# Patient Record
Sex: Male | Born: 1935 | Race: White | Hispanic: No | Marital: Married | Smoking: Never smoker
Health system: Southern US, Community
[De-identification: ages and names within clinical notes are randomized; demographics above are authoritative.]

## PROBLEM LIST (undated history)

## (undated) DIAGNOSIS — I10 Essential (primary) hypertension: Secondary | ICD-10-CM

## (undated) DIAGNOSIS — Z9229 Personal history of other drug therapy: Secondary | ICD-10-CM

## (undated) DIAGNOSIS — I4891 Unspecified atrial fibrillation: Secondary | ICD-10-CM

## (undated) DIAGNOSIS — I82409 Acute embolism and thrombosis of unspecified deep veins of unspecified lower extremity: Secondary | ICD-10-CM

## (undated) DIAGNOSIS — M1990 Unspecified osteoarthritis, unspecified site: Secondary | ICD-10-CM

## (undated) HISTORY — PX: TONSILLECTOMY: SUR1361

---

## 2003-03-16 ENCOUNTER — Emergency Department (HOSPITAL_COMMUNITY): Admission: AD | Admit: 2003-03-16 | Discharge: 2003-03-16 | Payer: Self-pay | Admitting: Family Medicine

## 2008-02-27 ENCOUNTER — Emergency Department (HOSPITAL_COMMUNITY): Admission: EM | Admit: 2008-02-27 | Discharge: 2008-02-27 | Payer: Self-pay | Admitting: Emergency Medicine

## 2008-08-18 ENCOUNTER — Emergency Department (HOSPITAL_COMMUNITY): Admission: EM | Admit: 2008-08-18 | Discharge: 2008-08-18 | Payer: Self-pay | Admitting: Emergency Medicine

## 2010-01-23 ENCOUNTER — Emergency Department (HOSPITAL_COMMUNITY): Admission: EM | Admit: 2010-01-23 | Discharge: 2009-04-10 | Payer: Self-pay | Admitting: Emergency Medicine

## 2010-05-07 LAB — CBC
MCHC: 33.8 g/dL (ref 30.0–36.0)
RBC: 4.22 MIL/uL (ref 4.22–5.81)
RDW: 13.4 % (ref 11.5–15.5)

## 2010-05-07 LAB — BASIC METABOLIC PANEL
CO2: 21 mEq/L (ref 19–32)
Calcium: 9.3 mg/dL (ref 8.4–10.5)
Creatinine, Ser: 0.99 mg/dL (ref 0.4–1.5)
GFR calc Af Amer: >60 mL/min (ref >60)
GFR calc non Af Amer: >60 mL/min (ref >60)
Sodium: 137 mEq/L (ref 135–145)

## 2010-05-07 LAB — DIFFERENTIAL
Basophils Absolute: 0 10*3/uL (ref 0.0–0.1)
Basophils Relative: 1 % (ref 0–1)
Lymphocytes Relative: 21 % (ref 12–46)
Neutro Abs: 3.5 10*3/uL (ref 1.7–7.7)
Neutrophils Relative %: 61 % (ref 43–77)

## 2010-05-07 LAB — POCT CARDIAC MARKERS: Myoglobin, poc: 187 ng/mL (ref 12–200)

## 2010-05-07 LAB — PROTIME-INR: INR: 2.09 — ABNORMAL HIGH (ref 0.00–1.49)

## 2010-05-20 ENCOUNTER — Emergency Department (HOSPITAL_COMMUNITY)
Admission: EM | Admit: 2010-05-20 | Discharge: 2010-05-21 | Disposition: A | Discharge status: Home / Self Care | Payer: Medicare Other | Attending: Emergency Medicine | Admitting: Emergency Medicine

## 2010-05-20 DIAGNOSIS — Z79899 Other long term (current) drug therapy: Secondary | ICD-10-CM | POA: Insufficient documentation

## 2010-05-20 DIAGNOSIS — I10 Essential (primary) hypertension: Secondary | ICD-10-CM | POA: Insufficient documentation

## 2010-05-20 DIAGNOSIS — G4730 Sleep apnea, unspecified: Secondary | ICD-10-CM | POA: Insufficient documentation

## 2010-05-20 DIAGNOSIS — R9431 Abnormal electrocardiogram [ECG] [EKG]: Secondary | ICD-10-CM | POA: Insufficient documentation

## 2010-05-20 DIAGNOSIS — I4891 Unspecified atrial fibrillation: Secondary | ICD-10-CM | POA: Insufficient documentation

## 2010-05-20 DIAGNOSIS — Z7901 Long term (current) use of anticoagulants: Secondary | ICD-10-CM | POA: Insufficient documentation

## 2010-05-25 LAB — BASIC METABOLIC PANEL
BUN: 20 mg/dL (ref 6–23)
CO2: 25 mEq/L (ref 19–32)
Chloride: 107 mEq/L (ref 96–112)
Creatinine, Ser: 1.41 mg/dL (ref 0.4–1.5)
Glucose, Bld: 106 mg/dL — ABNORMAL HIGH (ref 70–99)

## 2010-05-25 LAB — BASIC METABOLIC PANEL WITH GFR
Calcium: 9.6 mg/dL (ref 8.4–10.5)
GFR calc Af Amer: 58 mL/min — ABNORMAL LOW (ref >60)
GFR calc non Af Amer: 48 mL/min — ABNORMAL LOW (ref >60)
Potassium: 3.9 meq/L (ref 3.5–5.1)
Sodium: 138 meq/L (ref 135–145)

## 2010-05-25 LAB — DIFFERENTIAL
Basophils Absolute: 0 10*3/uL (ref 0.0–0.1)
Basophils Relative: 1 % (ref 0–1)
Eosinophils Absolute: 0.4 10*3/uL (ref 0.0–0.7)
Eosinophils Relative: 7 % — ABNORMAL HIGH (ref 0–5)
Lymphocytes Relative: 26 % (ref 12–46)
Lymphs Abs: 1.6 K/uL (ref 0.7–4.0)
Monocytes Absolute: 0.7 K/uL (ref 0.1–1.0)
Monocytes Relative: 11 % (ref 3–12)
Neutro Abs: 3.3 K/uL (ref 1.7–7.7)
Neutrophils Relative %: 55 % (ref 43–77)

## 2010-05-25 LAB — CBC
HCT: 41.2 % (ref 39.0–52.0)
Hemoglobin: 14.2 g/dL (ref 13.0–17.0)
MCHC: 34.4 g/dL (ref 30.0–36.0)
MCV: 94.8 fL (ref 78.0–100.0)
Platelets: 234 10*3/uL (ref 150–400)
RBC: 4.35 MIL/uL (ref 4.22–5.81)
RDW: 13.6 % (ref 11.5–15.5)
WBC: 6 K/uL (ref 4.0–10.5)

## 2010-05-25 LAB — APTT: aPTT: 44 s — ABNORMAL HIGH (ref 24–37)

## 2010-05-25 LAB — PROTIME-INR
INR: 3.2 — ABNORMAL HIGH (ref 0.00–1.49)
Prothrombin Time: 35.5 seconds — ABNORMAL HIGH (ref 11.6–15.2)

## 2010-05-25 LAB — POCT CARDIAC MARKERS
CKMB, poc: 4.8 ng/mL (ref 1.0–8.0)
Myoglobin, poc: 127 ng/mL (ref 12–200)
Troponin i, poc: <0.05 ng/mL (ref 0.00–0.09)

## 2010-06-02 LAB — DIFFERENTIAL
Basophils Absolute: 0 10*3/uL (ref 0.0–0.1)
Eosinophils Relative: 4 % (ref 0–5)
Lymphocytes Relative: 18 % (ref 12–46)
Lymphs Abs: 1.3 10*3/uL (ref 0.7–4.0)
Monocytes Absolute: 0.8 10*3/uL (ref 0.1–1.0)

## 2010-06-02 LAB — CBC
HCT: 40.4 % (ref 39.0–52.0)
Hemoglobin: 13.7 g/dL (ref 13.0–17.0)
RDW: 12.9 % (ref 11.5–15.5)

## 2010-06-02 LAB — BASIC METABOLIC PANEL
Chloride: 101 mEq/L (ref 96–112)
GFR calc non Af Amer: >60 mL/min (ref >60)
Glucose, Bld: 107 mg/dL — ABNORMAL HIGH (ref 70–99)
Potassium: 3.6 mEq/L (ref 3.5–5.1)
Sodium: 136 mEq/L (ref 135–145)

## 2013-01-05 ENCOUNTER — Emergency Department (HOSPITAL_COMMUNITY): Payer: Medicare Other

## 2013-01-05 ENCOUNTER — Emergency Department (HOSPITAL_COMMUNITY)
Admission: EM | Admit: 2013-01-05 | Discharge: 2013-01-05 | Disposition: A | Discharge status: Home / Self Care | Payer: Medicare Other | Attending: Emergency Medicine | Admitting: Emergency Medicine

## 2013-01-05 ENCOUNTER — Encounter (HOSPITAL_COMMUNITY): Payer: Self-pay | Admitting: Emergency Medicine

## 2013-01-05 DIAGNOSIS — R059 Cough, unspecified: Secondary | ICD-10-CM | POA: Insufficient documentation

## 2013-01-05 DIAGNOSIS — I10 Essential (primary) hypertension: Secondary | ICD-10-CM | POA: Insufficient documentation

## 2013-01-05 DIAGNOSIS — R05 Cough: Secondary | ICD-10-CM | POA: Insufficient documentation

## 2013-01-05 DIAGNOSIS — I824Y9 Acute embolism and thrombosis of unspecified deep veins of unspecified proximal lower extremity: Secondary | ICD-10-CM | POA: Insufficient documentation

## 2013-01-05 DIAGNOSIS — I82402 Acute embolism and thrombosis of unspecified deep veins of left lower extremity: Secondary | ICD-10-CM

## 2013-01-05 HISTORY — DX: Unspecified atrial fibrillation: I48.91

## 2013-01-05 HISTORY — DX: Essential (primary) hypertension: I10

## 2013-01-05 LAB — COMPREHENSIVE METABOLIC PANEL
AST: 19 U/L (ref 0–37)
Alkaline Phosphatase: 80 U/L (ref 39–117)
BUN: 35 mg/dL — ABNORMAL HIGH (ref 6–23)
CO2: 26 mEq/L (ref 19–32)
Calcium: 10.1 mg/dL (ref 8.4–10.5)
Chloride: 98 mEq/L (ref 96–112)
Creatinine, Ser: 1.69 mg/dL — ABNORMAL HIGH (ref 0.50–1.35)
GFR calc Af Amer: 41 mL/min — ABNORMAL LOW (ref >90)
GFR calc non Af Amer: 35 mL/min — ABNORMAL LOW (ref >90)
Potassium: 3.7 mEq/L (ref 3.5–5.1)
Total Bilirubin: 0.6 mg/dL (ref 0.3–1.2)

## 2013-01-05 LAB — CBC WITH DIFFERENTIAL/PLATELET
Eosinophils Relative: 1 % (ref 0–5)
HCT: 37.7 % — ABNORMAL LOW (ref 39.0–52.0)
Hemoglobin: 12.3 g/dL — ABNORMAL LOW (ref 13.0–17.0)
Lymphocytes Relative: 12 % (ref 12–46)
MCHC: 32.6 g/dL (ref 30.0–36.0)
MCV: 94.7 fL (ref 78.0–100.0)
Monocytes Absolute: 0.9 10*3/uL (ref 0.1–1.0)
Monocytes Relative: 11 % (ref 3–12)
Neutro Abs: 6.1 10*3/uL (ref 1.7–7.7)
Platelets: 212 10*3/uL (ref 150–400)
WBC: 8.1 10*3/uL (ref 4.0–10.5)

## 2013-01-05 LAB — PRO B NATRIURETIC PEPTIDE: Pro B Natriuretic peptide (BNP): 176.3 pg/mL (ref 0–450)

## 2013-01-05 LAB — TROPONIN I: Troponin I: <0.3 ng/mL (ref <0.30)

## 2013-01-05 LAB — PROTIME-INR: INR: 1.02 (ref 0.00–1.49)

## 2013-01-05 MED ORDER — ENOXAPARIN (LOVENOX) PATIENT EDUCATION KIT
PACK | Freq: Once | Status: DC
  Filled 2013-01-05: qty 1

## 2013-01-05 MED ORDER — SODIUM CHLORIDE 0.9 % IV BOLUS (SEPSIS)
125.0000 mL | Freq: Once | INTRAVENOUS | Status: AC
  Administered 2013-01-05: 125 mL via INTRAVENOUS

## 2013-01-05 MED ORDER — HYDROMORPHONE HCL PF 1 MG/ML IJ SOLN
0.5000 mg | Freq: Once | INTRAMUSCULAR | Status: AC
  Administered 2013-01-05: 0.5 mg via INTRAVENOUS
  Filled 2013-01-05: qty 1

## 2013-01-05 MED ORDER — WARFARIN SODIUM 5 MG PO TABS
5.0000 mg | ORAL_TABLET | Freq: Once | ORAL | Status: AC
  Administered 2013-01-05: 5 mg via ORAL
  Filled 2013-01-05: qty 1

## 2013-01-05 MED ORDER — WARFARIN SODIUM 5 MG PO TABS: 5.0000 mg | ORAL_TABLET | Freq: Every day | ORAL | Status: DC

## 2013-01-05 MED ORDER — COUMADIN BOOK
Freq: Once | Status: DC
  Filled 2013-01-05: qty 1

## 2013-01-05 MED ORDER — ENOXAPARIN SODIUM 100 MG/ML ~~LOC~~ SOLN: 1.5000 mg/kg | SUBCUTANEOUS | Status: DC

## 2013-01-05 MED ORDER — ENOXAPARIN SODIUM 100 MG/ML ~~LOC~~ SOLN
120.0000 mg | Freq: Once | SUBCUTANEOUS | Status: AC
  Administered 2013-01-05: 120 mg via SUBCUTANEOUS
  Filled 2013-01-05: qty 2

## 2013-01-05 MED ORDER — ENOXAPARIN SODIUM 100 MG/ML ~~LOC~~ SOLN: 80.0000 mg | Freq: Once | SUBCUTANEOUS | Status: DC

## 2013-01-05 MED ORDER — WARFARIN - PHYSICIAN DOSING INPATIENT: Freq: Every day | Status: DC

## 2013-01-05 NOTE — ED Notes (Signed)
C/o stabbing right sided cp x 2 days.  Reports has had a bad productive cough and thinks may have cracked a rib.  Reports pain is worse with movement and breathing.  Denies n/v/dizziness/diaphoresis.

## 2013-01-05 NOTE — ED Provider Notes (Signed)
CSN: [id number]     Arrival date & time 01/05/13  4098 History  This chart was scribed for Gerhard Munch, MD by Quintella Reichert, ED scribe.  This patient was seen in room [facility]18/[facility]18 and the patient's care was started at 10:02 AM.   Chief Complaint  Patient presents with  . Chest Pain  . Cough    The history is provided by the patient. No language interpreter was used.    HPI Comments: SHAE AUGELLO is a 79 y.o. male with h/o A-Fib (not on anticoagulants) and HTN who presents to the Emergency Department complaining of 2 days of stabbing right-sided chest pain with associated cough and asymmetrical calf swelling.  Pt states he has had a severe productive cough for 3 days and thinks that he may have cracked a rib or pulled a muscle in his chest.  Pain is worsened by deep breathing and he states he is taking shallow breaths.  He also complains of swelling in his left calf that he noticed 2 days ago.  He denies fever, difficulty swallowing or speaking, weakness or numbness in legs or feet, or any other associated symptoms.  Pt notes that several weeks ago he had similar pain on the left side of his chest and he called EMS.  When they arrived he was told that EKG was normal so he stayed home and his pain resolved by the next day.  He reports he has some postnasal drip chronically with mild associated cough but his most recent cough has been more severe and is productive of a large amount of sputum.  Pt was told by his cardiologist that he does not likely need to take blood-thinners.  He has not had an A-Fib attack in over a year.  He medicates regularly with metoprolol and lisinopril.  He denies h/o kidney problems.  He denies medication allergies.   Past Medical History  Diagnosis Date  . Atrial fibrillation   . Hypertension     Past Surgical History  Procedure Laterality Date  . Tonsillectomy      No family history on file.   History  Substance Use Topics  . Smoking status: Never  Smoker   . Smokeless tobacco: Not on file  . Alcohol Use: 1.2 oz/week    2 Glasses of wine per week     Review of Systems  Constitutional:       Per HPI, otherwise negative  HENT:       Per HPI, otherwise negative  Respiratory:       Per HPI, otherwise negative  Cardiovascular:       Per HPI, otherwise negative  Endocrine:       Negative aside from HPI  Genitourinary:       Neg aside from HPI   Musculoskeletal:       Per HPI, otherwise negative  Skin: Negative.   Neurological: Negative for syncope.     Allergies  Review of patient's allergies indicates no known allergies.  Home Medications  No current outpatient prescriptions on file.  Pulse 85  Temp(Src) 97.9 F (36.6 C) (Oral)  Resp 19  SpO2 95%  Physical Exam  Nursing note and vitals reviewed. Constitutional: He is oriented to person, place, and time. He appears well-developed. No distress.  HENT:  Head: Normocephalic and atraumatic.  Eyes: Conjunctivae and EOM are normal.  Cardiovascular: Normal rate and regular rhythm.   Pulmonary/Chest: Effort normal. No stridor. No respiratory distress. He exhibits tenderness (point tenderness in anterior  axilla, no deformity).  Breath sounds diminished  Abdominal: He exhibits no distension.  Musculoskeletal:  Noticeably larger and somewhat distended in left calf  Neurological: He is alert and oriented to person, place, and time.  Skin: Skin is warm and dry.  Psychiatric: He has a normal mood and affect.    ED Course  Procedures (including critical care time)    COORDINATION OF CARE: 10:12 AM-Discussed treatment plan which includes pain medication, CXR, ultrasound, EKG, and labs with pt at bedside and pt agreed to plan.    Labs Review Labs Reviewed  CBC WITH DIFFERENTIAL - Abnormal; Notable for the following:    RBC 3.98 (*)    Hemoglobin 12.3 (*)    HCT 37.7 (*)    All other components within normal limits  COMPREHENSIVE METABOLIC PANEL - Abnormal;  Notable for the following:    Sodium 134 (*)    Glucose, Bld 106 (*)    BUN 35 (*)    Creatinine, Ser 1.69 (*)    Total Protein 9.1 (*)    GFR calc non Af Amer 35 (*)    GFR calc Af Amer 41 (*)    All other components within normal limits  D-DIMER, QUANTITATIVE - Abnormal; Notable for the following:    D-Dimer, Quant 6.18 (*)    All other components within normal limits  TROPONIN I  PRO B NATRIURETIC PEPTIDE  PROTIME-INR    Imaging Review Dg Ribs Unilateral W/chest Right  01/05/2013   CLINICAL DATA:  Right side chest pain for 3 days.  EXAM: RIGHT RIBS AND CHEST - 3+ VIEW  COMPARISON:  PA and lateral chest 08/18/2008.  FINDINGS: Lung volumes are low with mild subsegmental atelectasis in the lung bases, more notable on the left. No pneumothorax or pleural fluid is identified. Heart size is normal. No rib fracture is seen.  IMPRESSION: Negative for fracture.  No acute finding.   Electronically Signed   By: Drusilla Kanner M.D.   On: 01/05/2013 10:50   US Venous Img Lower Bilateral  01/05/2013   CLINICAL DATA:  Lower extremity and chest pain  EXAM: VENOUS DUPLEX ULTRASOUND OF BILATERAL LOWER EXTREMITIES  TECHNIQUE: Gray-scale sonography with graded compression, as well as color Doppler and duplex ultrasound, were performed to evaluate the deep venous system from the level of the common femoral vein through the popliteal and proximal calf veins. Spectral Doppler was utilized to evaluate flow at rest and with distal augmentation maneuvers.  COMPARISON:  None.  FINDINGS: On the left, there is acute obstructing thrombus in the distal superficial femoral and popliteal veins consistent with acute deep venous thrombosis. There is absence of spontaneity and phasisity of flow in these areas. There is absence of compression and augmentation in these areas of acute thrombus on the left. There is node venous Doppler signal in the areas of acute obstructive deep venous thrombosis. Elsewhere in the left lower  extremity, no thrombus is seen. There is normal compression and augmentation in areas for thrombus is not seen. Venous Doppler signal is normal in these regions. Flow is spontaneous and phasic in areas where acute thrombus is not seen on the left. No superficial venous thrombosis seen on the left.  On the right, flow is spontaneous and phasic in all segments. There is normal compression and augmentation in the venous structures of the right lower extremity. Venous Doppler is normal in all regions on the right. There is no evidence of right lower extremity deep venous thrombosis. No thrombus  is seen in superficial venous structures on the right.  There is no deep venous incompetence on either side.  IMPRESSION: Acute appearing obstructive thrombus in the distal superficial femoral vein and popliteal vein regions on the left, consistent with acute deep venous thrombosis on the left.  There is no right-sided deep venous thrombosis.  Study otherwise unremarkable.  Critical Value/emergent results were called by telephone at the time of interpretation on 01/05/2013 at 11:42 AM to Dr.Brinton Brandel , who verbally acknowledged these results.   Electronically Signed   By: Bretta Bang M.D.   On: 01/05/2013 11:42    EKG Interpretation    Date/Time:  Thursday January 05 2013 09:50:41 EST Ventricular Rate:  80 PR Interval:  202 QRS Duration: 98 QT Interval:  378 QTC Calculation: 435 R Axis:   37 Text Interpretation:  Normal sinus rhythm Normal ECG When compared with ECG of 20-May-2010 23:54, Sinus rhythm has replaced Atrial fibrillation Artifact Normal ECG Confirmed by Gerhard Munch  MD [phone]) on 01/05/2013 12:10:04 PM           Update: I discussed all results with the patient and his wife. He requested outpatient management of his condition.  After I explained my recommendation for admission, and he continued to request outpatient management we had a lengthy discussion of the need for medication  compliance, and close monitoring. MDM   1. DVT (deep venous thrombosis), left    I personally performed the services described in this documentation, which was scribed in my presence. The recorded information has been reviewed and is accurate.   This patient p/w R inferior chest pain.  On exam his VS are stable.  With his L calf enlargement, there was concern for thromboembolism vs. Thrombosis.  Patient's evaluation demonstrates presence of left lower extremity DVT.  There remains concern for possible pulmonary muscle but given the patient's poor renal function CTA was not performed.  Patient was receiving treatment for thrombosis regardless, and this is unlikely to have changed management substantially, given the absence of hemodynamic instability, distress.  The patient adamantly insisted on outpatient management.  Given his age, capacity to followup with primary care/cardiology for INR checks, as he has been on this medication previously, this seemed reasonable.  We had lengthy conversations and return precautions.  The patient received Lovenox teaching, initial dose of anticoagulant, was discharged in stable condition.   Gerhard Munch, MD 01/06/13 [phone]

## 2013-01-05 NOTE — ED Notes (Signed)
Pt c/o right side chest pain x3 days. Pt reports "severe cough" that is productive with "regular sputum". Pt states movement and coughing worsens pain. Pt denies SOB.

## 2013-01-05 NOTE — ED Notes (Signed)
Spoke with Marcelino Duster in pharmacy and she will bring lovenox teaching kit.  Also waiting for INR result.  Pt notified of delay.

## 2013-03-08 ENCOUNTER — Encounter (HOSPITAL_COMMUNITY): Payer: Self-pay | Admitting: Emergency Medicine

## 2013-03-08 DIAGNOSIS — I10 Essential (primary) hypertension: Secondary | ICD-10-CM | POA: Insufficient documentation

## 2013-03-08 DIAGNOSIS — Z8639 Personal history of other endocrine, nutritional and metabolic disease: Secondary | ICD-10-CM | POA: Insufficient documentation

## 2013-03-08 DIAGNOSIS — Z7982 Long term (current) use of aspirin: Secondary | ICD-10-CM | POA: Insufficient documentation

## 2013-03-08 DIAGNOSIS — L089 Local infection of the skin and subcutaneous tissue, unspecified: Secondary | ICD-10-CM | POA: Insufficient documentation

## 2013-03-08 DIAGNOSIS — Z7901 Long term (current) use of anticoagulants: Secondary | ICD-10-CM | POA: Insufficient documentation

## 2013-03-08 DIAGNOSIS — Z862 Personal history of diseases of the blood and blood-forming organs and certain disorders involving the immune mechanism: Secondary | ICD-10-CM | POA: Insufficient documentation

## 2013-03-08 DIAGNOSIS — Z792 Long term (current) use of antibiotics: Secondary | ICD-10-CM | POA: Insufficient documentation

## 2013-03-08 DIAGNOSIS — Z79899 Other long term (current) drug therapy: Secondary | ICD-10-CM | POA: Insufficient documentation

## 2013-03-08 DIAGNOSIS — I4891 Unspecified atrial fibrillation: Secondary | ICD-10-CM | POA: Insufficient documentation

## 2013-03-08 DIAGNOSIS — Z86718 Personal history of other venous thrombosis and embolism: Secondary | ICD-10-CM | POA: Insufficient documentation

## 2013-03-08 NOTE — ED Notes (Signed)
Pt states his right great toe and 2nd toe are red, swollen, and painful, states the 2nd toe started with an ingrown nail and seems to have spread to the great toe.

## 2013-03-09 ENCOUNTER — Emergency Department (HOSPITAL_COMMUNITY)
Admission: EM | Admit: 2013-03-09 | Discharge: 2013-03-09 | Disposition: A | Discharge status: Home / Self Care | Payer: Medicare Other | Attending: Emergency Medicine | Admitting: Emergency Medicine

## 2013-03-09 DIAGNOSIS — L089 Local infection of the skin and subcutaneous tissue, unspecified: Secondary | ICD-10-CM

## 2013-03-09 MED ORDER — OXYCODONE-ACETAMINOPHEN 5-325 MG PO TABS: 2.0000 | ORAL_TABLET | ORAL | Status: AC | PRN

## 2013-03-09 MED ORDER — CLINDAMYCIN HCL 150 MG PO CAPS
300.0000 mg | ORAL_CAPSULE | Freq: Once | ORAL | Status: AC
  Administered 2013-03-09: 300 mg via ORAL
  Filled 2013-03-09: qty 2

## 2013-03-09 MED ORDER — HYDROCODONE-ACETAMINOPHEN 5-325 MG PO TABS: 1.0000 | ORAL_TABLET | ORAL | Status: DC | PRN

## 2013-03-09 MED ORDER — CLINDAMYCIN HCL 300 MG PO CAPS: 300.0000 mg | ORAL_CAPSULE | Freq: Four times a day (QID) | ORAL | Status: AC

## 2013-03-09 NOTE — ED Notes (Signed)
Discharge instructions given and reviewed with patient.  Prescriptions given for Hydrocodone and Cleocin; effects and use explained.  Percocet pre-pack given.  Patient verbalized understanding to complete all antibiotic and sedating effects of pain medication.  Patient ambulatory; discharged home in good condition.

## 2013-03-09 NOTE — ED Provider Notes (Signed)
CSN: [id number]     Arrival date & time 03/08/13  2333 History   First MD Initiated Contact with Patient 03/09/13 0105     Chief Complaint  Patient presents with  . Nail Problem    HPI Patient reports possible infection in his second toe on his right foot.  He reports a swollen painful.  He states that he injured his toe cutting his toenails approximately 10 days ago.  Now has developed pain at the base of his first toe of his right foot.  He does have a history of gout.  He's not convinced this is like his prior gout.  Denies fevers and chills.  He states that the majority of the redness is associated to the second toe.  He states pain on ambulation.  He has not seen his primary care physician.  Over-the-counter pain medications without improvement in his symptoms.  He cut it comes because of severity of throbbing pain.  Otherwise he has no complaints.  He does have a history of DVT and is on Coumadin.   Past Medical History  Diagnosis Date  . Atrial fibrillation   . Hypertension    Past Surgical History  Procedure Laterality Date  . Tonsillectomy     No family history on file. History  Substance Use Topics  . Smoking status: Never Smoker   . Smokeless tobacco: Not on file  . Alcohol Use: 1.2 oz/week    2 Glasses of wine per week    Review of Systems  All other systems reviewed and are negative.    Allergies  Review of patient's allergies indicates no known allergies.  Home Medications   Current Outpatient Rx  Name  Route  Sig  Dispense  Refill  . lisinopril-hydrochlorothiazide (PRINZIDE,ZESTORETIC) 10-12.5 MG per tablet   Oral   Take 1 tablet by mouth daily.         . metoprolol succinate (TOPROL-XL) 50 MG 24 hr tablet   Oral   Take 50 mg by mouth at bedtime.         Marland Kitchen warfarin (COUMADIN) 5 MG tablet   Oral   Take 1 tablet (5 mg total) by mouth daily.   30 tablet   0   . aspirin 81 MG tablet   Oral   Take 81 mg by mouth daily.         . clindamycin  (CLEOCIN) 300 MG capsule   Oral   Take 1 capsule (300 mg total) by mouth 4 (four) times daily.   28 capsule   0   . enoxaparin (LOVENOX) 100 MG/ML injection   Subcutaneous   Inject 1.2 mLs (120 mg total) into the skin daily. Please stop upon direction of your physician   10 Syringe   0   . HYDROcodone-acetaminophen (NORCO/VICODIN) 5-325 MG per tablet   Oral   Take 1 tablet by mouth every 4 (four) hours as needed for moderate pain.   10 tablet   0   . oxyCODONE-acetaminophen (PERCOCET/ROXICET) 5-325 MG per tablet   Oral   Take 2 tablets by mouth every 4 (four) hours as needed for severe pain.   6 tablet   0    BP 155/75  Pulse 78  Temp(Src) 97.9 F (36.6 C) (Oral)  Resp 18  Ht 5\' 9"  (1.753 m)  Wt 180 lb (81.647 kg)  BMI 26.57 kg/m2  SpO2 97% Physical Exam  Nursing note and vitals reviewed. Constitutional: He is oriented to person, place, and time.  He appears well-developed and well-nourished.  HENT:  Head: Normocephalic.  Eyes: EOM are normal.  Neck: Normal range of motion.  Pulmonary/Chest: Effort normal.  Abdominal: He exhibits no distension.  Musculoskeletal: Normal range of motion.  Normal PT and DP pulses bilaterally.  Patient with erythema and tenderness of his right second toe, without obvious fluctuance or drainable abscess.  Patient does have some tenderness at the base of his great toe on his right foot without erythema or fluctuance or obvious effusion.  Neurological: He is alert and oriented to person, place, and time.  Psychiatric: He has a normal mood and affect.    ED Course  Procedures (including critical care time) Labs Review Labs Reviewed - No data to display Imaging Review No results found.  EKG Interpretation   None       MDM   1. Toe infection    Patient appears to have an infection likely stemming from a paronychia or infection of his right second toe.  Patient be started on antibiotics.  Patient does have pain it is base of his  first toe on his right foot as well.  This could represent a gout flare although could be related and reactive from his toe infection on his right second toe.  Not convinced that this is a septic arthritis at this time.  The patient be started on antibiotics and pain medicine and he has been asked to follow back up in the emergency department in 48 hours for recheck.  Have asked the patient to return sooner as needed for worsening symptoms.  Overall well-appearing.  No fevers or chills.  No signs of systemic illness.  Very functional 80 year old male    Lyanne CoKevin M Kayne Yuhas, MD 03/09/13 [phone]

## 2013-03-11 ENCOUNTER — Emergency Department (HOSPITAL_COMMUNITY)
Admission: EM | Admit: 2013-03-11 | Discharge: 2013-03-11 | Disposition: A | Discharge status: Home / Self Care | Payer: Medicare Other | Attending: Emergency Medicine | Admitting: Emergency Medicine

## 2013-03-11 ENCOUNTER — Encounter (HOSPITAL_COMMUNITY): Payer: Self-pay | Admitting: Emergency Medicine

## 2013-03-11 ENCOUNTER — Emergency Department (HOSPITAL_COMMUNITY): Payer: Medicare Other

## 2013-03-11 DIAGNOSIS — L0889 Other specified local infections of the skin and subcutaneous tissue: Secondary | ICD-10-CM | POA: Insufficient documentation

## 2013-03-11 DIAGNOSIS — L089 Local infection of the skin and subcutaneous tissue, unspecified: Secondary | ICD-10-CM

## 2013-03-11 DIAGNOSIS — Z7901 Long term (current) use of anticoagulants: Secondary | ICD-10-CM | POA: Insufficient documentation

## 2013-03-11 DIAGNOSIS — Z792 Long term (current) use of antibiotics: Secondary | ICD-10-CM | POA: Insufficient documentation

## 2013-03-11 DIAGNOSIS — M79609 Pain in unspecified limb: Secondary | ICD-10-CM | POA: Insufficient documentation

## 2013-03-11 DIAGNOSIS — I4891 Unspecified atrial fibrillation: Secondary | ICD-10-CM | POA: Insufficient documentation

## 2013-03-11 DIAGNOSIS — I10 Essential (primary) hypertension: Secondary | ICD-10-CM | POA: Insufficient documentation

## 2013-03-11 DIAGNOSIS — Z79899 Other long term (current) drug therapy: Secondary | ICD-10-CM | POA: Insufficient documentation

## 2013-03-11 LAB — COMPREHENSIVE METABOLIC PANEL
ALK PHOS: 76 U/L (ref 39–117)
ALT: 17 U/L (ref 0–53)
AST: 5 U/L (ref 0–37)
Albumin: 3.4 g/dL — ABNORMAL LOW (ref 3.5–5.2)
BILIRUBIN TOTAL: 0.3 mg/dL (ref 0.3–1.2)
BUN: 35 mg/dL — AB (ref 6–23)
CHLORIDE: 102 meq/L (ref 96–112)
CO2: 22 mEq/L (ref 19–32)
Calcium: 9 mg/dL (ref 8.4–10.5)
Creatinine, Ser: 1.59 mg/dL — ABNORMAL HIGH (ref 0.50–1.35)
GFR calc Af Amer: 44 mL/min — ABNORMAL LOW (ref >90)
GFR calc non Af Amer: 38 mL/min — ABNORMAL LOW (ref >90)
Glucose, Bld: 130 mg/dL — ABNORMAL HIGH (ref 70–99)
POTASSIUM: 4.2 meq/L (ref 3.7–5.3)
SODIUM: 136 meq/L — AB (ref 137–147)
TOTAL PROTEIN: 8.2 g/dL (ref 6.0–8.3)

## 2013-03-11 LAB — CBC WITH DIFFERENTIAL/PLATELET
BASOS PCT: 0 % (ref 0–1)
Basophils Absolute: 0 10*3/uL (ref 0.0–0.1)
Eosinophils Absolute: 0.4 10*3/uL (ref 0.0–0.7)
Eosinophils Relative: 8 % — ABNORMAL HIGH (ref 0–5)
HCT: 36.3 % — ABNORMAL LOW (ref 39.0–52.0)
HEMOGLOBIN: 12.2 g/dL — AB (ref 13.0–17.0)
LYMPHS ABS: 1.1 10*3/uL (ref 0.7–4.0)
Lymphocytes Relative: 21 % (ref 12–46)
MCH: 31.2 pg (ref 26.0–34.0)
MCHC: 33.6 g/dL (ref 30.0–36.0)
MCV: 92.8 fL (ref 78.0–100.0)
Monocytes Absolute: 0.5 10*3/uL (ref 0.1–1.0)
Monocytes Relative: 10 % (ref 3–12)
NEUTROS PCT: 61 % (ref 43–77)
Neutro Abs: 3.2 10*3/uL (ref 1.7–7.7)
PLATELETS: 246 10*3/uL (ref 150–400)
RBC: 3.91 MIL/uL — AB (ref 4.22–5.81)
RDW: 13.4 % (ref 11.5–15.5)
WBC: 5.2 10*3/uL (ref 4.0–10.5)

## 2013-03-11 LAB — PROTIME-INR
INR: 2.95 — ABNORMAL HIGH (ref 0.00–1.49)
PROTHROMBIN TIME: 29.7 s — AB (ref 11.6–15.2)

## 2013-03-11 MED ORDER — VANCOMYCIN HCL IN DEXTROSE 1-5 GM/200ML-% IV SOLN
1000.0000 mg | Freq: Once | INTRAVENOUS | Status: AC
  Administered 2013-03-11: 1000 mg via INTRAVENOUS
  Filled 2013-03-11: qty 200

## 2013-03-11 NOTE — Discharge Instructions (Signed)
Continue antibiotics and follow up with your md Monday for recheck

## 2013-03-11 NOTE — ED Notes (Signed)
Pt states he was seen recently for infection to left 2nd toe and given abx for treatment. Pt was told by EDP to return to ED for recheck today.

## 2013-03-11 NOTE — ED Provider Notes (Signed)
CSN: [id number]     Arrival date & time 03/11/13  1453 History   First MD Initiated Contact with Patient 03/11/13 1511     Chief Complaint  Patient presents with  . Wound Check   (Consider location/radiation/quality/duration/timing/severity/associated sxs/prior Treatment) Patient is a 80 y.o. male presenting with wound check. The history is provided by the patient (pt here for recheck of right 2nd toe.  he is being treated with cleocin for infection).  Wound Check This is a new problem. The current episode started more than 2 days ago. The problem occurs constantly. The problem has been gradually improving. Pertinent negatives include no chest pain, no abdominal pain and no headaches. Nothing aggravates the symptoms. Nothing relieves the symptoms.    Past Medical History  Diagnosis Date  . Atrial fibrillation   . Hypertension    Past Surgical History  Procedure Laterality Date  . Tonsillectomy     History reviewed. No pertinent family history. History  Substance Use Topics  . Smoking status: Never Smoker   . Smokeless tobacco: Not on file  . Alcohol Use: 1.2 oz/week    2 Glasses of wine per week    Review of Systems  Constitutional: Negative for appetite change and fatigue.  HENT: Negative for congestion, ear discharge and sinus pressure.   Eyes: Negative for discharge.  Respiratory: Negative for cough.   Cardiovascular: Negative for chest pain.  Gastrointestinal: Negative for abdominal pain and diarrhea.  Genitourinary: Negative for frequency and hematuria.  Musculoskeletal: Negative for back pain.       Pain right 2nd toe  Skin: Negative for rash.  Neurological: Negative for seizures and headaches.  Psychiatric/Behavioral: Negative for hallucinations.    Allergies  Lactose intolerance (gi)  Home Medications   Current Outpatient Rx  Name  Route  Sig  Dispense  Refill  . cetirizine (QC ALL DAY ALLERGY) 10 MG tablet   Oral   Take 10 mg by mouth daily.          . clindamycin (CLEOCIN) 300 MG capsule   Oral   Take 1 capsule (300 mg total) by mouth 4 (four) times daily.   28 capsule   0   . lisinopril-hydrochlorothiazide (PRINZIDE,ZESTORETIC) 10-12.5 MG per tablet   Oral   Take 1 tablet by mouth daily.         . metoprolol succinate (TOPROL-XL) 50 MG 24 hr tablet   Oral   Take 50 mg by mouth at bedtime.         Marland Kitchen oxyCODONE-acetaminophen (PERCOCET/ROXICET) 5-325 MG per tablet   Oral   Take 2 tablets by mouth every 4 (four) hours as needed for severe pain.   6 tablet   0   . warfarin (COUMADIN) 5 MG tablet   Oral   Take 1 tablet (5 mg total) by mouth daily.   30 tablet   0    BP 133/61  Pulse 71  Temp(Src) 97.4 F (36.3 C) (Oral)  Resp 19  SpO2 99% Physical Exam  Constitutional: He is oriented to person, place, and time. He appears well-developed.  HENT:  Head: Normocephalic.  Eyes: Conjunctivae are normal.  Neck: No tracheal deviation present.  Cardiovascular:  No murmur heard. Musculoskeletal: Normal range of motion.  Swollen tender right 2nd toe.  Improving according to pt  Neurological: He is oriented to person, place, and time.  Skin: Skin is warm.  Psychiatric: He has a normal mood and affect.    ED Course  Procedures (including  critical care time) Labs Review Labs Reviewed  CBC WITH DIFFERENTIAL - Abnormal; Notable for the following:    RBC 3.91 (*)    Hemoglobin 12.2 (*)    HCT 36.3 (*)    Eosinophils Relative 8 (*)    All other components within normal limits  COMPREHENSIVE METABOLIC PANEL - Abnormal; Notable for the following:    Sodium 136 (*)    Glucose, Bld 130 (*)    BUN 35 (*)    Creatinine, Ser 1.59 (*)    Albumin 3.4 (*)    GFR calc non Af Amer 38 (*)    GFR calc Af Amer 44 (*)    All other components within normal limits  PROTIME-INR - Abnormal; Notable for the following:    Prothrombin Time 29.7 (*)    INR 2.95 (*)    All other components within normal limits   Imaging Review Dg  Foot Complete Right  03/11/2013   CLINICAL DATA:  Toe pain, erythema, and swelling.  EXAM: RIGHT FOOT COMPLETE - 3+ VIEW  COMPARISON:  None.  FINDINGS: Mild soft tissue swelling is seen involving the right second toe. No evidence of fracture or dislocation. No evidence of osteolysis or periosteal reaction. No evidence of radiopaque foreign body or soft tissue emphysema. No other significant bone abnormality involving the toes or foot.  IMPRESSION: Soft tissue swelling of the second toe.  No osseous abnormality.   Electronically Signed   By: Myles RosenthalJohn  Stahl M.D.   On: 03/11/2013 16:26    EKG Interpretation   None       MDM   1. Skin infection    Follow up in 2 days   Benny LennertJoseph L Jabria Loos, MD 03/11/13 [phone]

## 2013-03-11 NOTE — ED Notes (Signed)
Patient with no complaints at this time. Respirations even and unlabored. Skin warm/dry. Discharge instructions reviewed with patient at this time. Patient given opportunity to voice concerns/ask questions. IV removed per policy and band-aid applied to site. Patient discharged at this time and left Emergency Department with steady gait.  

## 2013-03-11 NOTE — ED Notes (Signed)
CRITICAL VALUE ALERT  Critical value received:  Correction value received- AST 5-  Was told first result was on undiluted sample and was incorrect.  Reports 2nd result in computer is correct and WNL per lab Date of notification:  03/11/2013  Time of notification:  1643  Critical value read back:yes  Nurse who received alert[facility]  [facility]  MD notified (1st page):  Dr. Estell HarpinZammit  Time of first page:  1644  MD notified (2nd page):  Time of second page:  Responding MD:  Dr. Estell HarpinZammit  Time MD responded:  Dr. Estell HarpinZammit

## 2013-03-15 MED FILL — Oxycodone w/ Acetaminophen Tab 5-325 MG: ORAL | Qty: 6 | Status: AC

## 2013-03-19 ENCOUNTER — Emergency Department (HOSPITAL_COMMUNITY)
Admission: EM | Admit: 2013-03-19 | Discharge: 2013-03-19 | Disposition: A | Discharge status: Home / Self Care | Payer: Medicare Other | Attending: Emergency Medicine | Admitting: Emergency Medicine

## 2013-03-19 ENCOUNTER — Encounter (HOSPITAL_COMMUNITY): Payer: Self-pay | Admitting: Emergency Medicine

## 2013-03-19 DIAGNOSIS — Z792 Long term (current) use of antibiotics: Secondary | ICD-10-CM | POA: Insufficient documentation

## 2013-03-19 DIAGNOSIS — I4891 Unspecified atrial fibrillation: Secondary | ICD-10-CM | POA: Insufficient documentation

## 2013-03-19 DIAGNOSIS — Z79899 Other long term (current) drug therapy: Secondary | ICD-10-CM | POA: Insufficient documentation

## 2013-03-19 DIAGNOSIS — Z7901 Long term (current) use of anticoagulants: Secondary | ICD-10-CM | POA: Insufficient documentation

## 2013-03-19 DIAGNOSIS — I10 Essential (primary) hypertension: Secondary | ICD-10-CM | POA: Insufficient documentation

## 2013-03-19 DIAGNOSIS — K625 Hemorrhage of anus and rectum: Secondary | ICD-10-CM | POA: Insufficient documentation

## 2013-03-19 LAB — CBC WITH DIFFERENTIAL/PLATELET
BASOS PCT: 1 % (ref 0–1)
Basophils Absolute: 0.1 10*3/uL (ref 0.0–0.1)
Eosinophils Absolute: 0.5 10*3/uL (ref 0.0–0.7)
Eosinophils Relative: 10 % — ABNORMAL HIGH (ref 0–5)
HEMATOCRIT: 37.2 % — AB (ref 39.0–52.0)
Hemoglobin: 12.7 g/dL — ABNORMAL LOW (ref 13.0–17.0)
Lymphocytes Relative: 23 % (ref 12–46)
Lymphs Abs: 1.2 10*3/uL (ref 0.7–4.0)
MCH: 31.4 pg (ref 26.0–34.0)
MCHC: 34.1 g/dL (ref 30.0–36.0)
MCV: 91.9 fL (ref 78.0–100.0)
MONO ABS: 0.4 10*3/uL (ref 0.1–1.0)
Monocytes Relative: 8 % (ref 3–12)
Neutro Abs: 3 10*3/uL (ref 1.7–7.7)
Neutrophils Relative %: 58 % (ref 43–77)
Platelets: 358 10*3/uL (ref 150–400)
RBC: 4.05 MIL/uL — ABNORMAL LOW (ref 4.22–5.81)
RDW: 12.9 % (ref 11.5–15.5)
WBC: 5.2 10*3/uL (ref 4.0–10.5)

## 2013-03-19 LAB — BASIC METABOLIC PANEL
BUN: 30 mg/dL — AB (ref 6–23)
CHLORIDE: 102 meq/L (ref 96–112)
CO2: 21 mEq/L (ref 19–32)
CREATININE: 1.49 mg/dL — AB (ref 0.50–1.35)
Calcium: 9.5 mg/dL (ref 8.4–10.5)
GFR calc non Af Amer: 41 mL/min — ABNORMAL LOW (ref >90)
GFR, EST AFRICAN AMERICAN: 47 mL/min — AB (ref >90)
GLUCOSE: 91 mg/dL (ref 70–99)
POTASSIUM: 4.1 meq/L (ref 3.7–5.3)
Sodium: 137 mEq/L (ref 137–147)

## 2013-03-19 LAB — PROTIME-INR
INR: 1.57 — ABNORMAL HIGH (ref 0.00–1.49)
Prothrombin Time: 18.3 seconds — ABNORMAL HIGH (ref 11.6–15.2)

## 2013-03-19 NOTE — ED Notes (Signed)
Pt reports being treated with antibiotics for an infected toe.  Reports that he noticed blood in his stool yesterday morning.  Pt also on coumadin. Reports last PT/INR slightly elevated.

## 2013-03-19 NOTE — ED Provider Notes (Signed)
CSN: [id number]     Arrival date & time 03/19/13  0611 History   First MD Initiated Contact with Patient 03/19/13 [phone]     Chief Complaint  Patient presents with  . GI Bleeding    Patient is a 80 y.o. male presenting with hematochezia. The history is provided by the patient and the spouse.  Rectal Bleeding Quality: black. Amount:  Scant Timing:  Intermittent Progression:  Unchanged Chronicity:  New Similar prior episodes: no   Relieved by:  None tried Worsened by:  Nothing tried Associated symptoms: no abdominal pain, no dizziness, no fever, no hematemesis and no vomiting   Risk factors: anticoagulant use    Pt reports his PCP told him recently that his coumadin/INR level was elevated.  He was advised to hold doses.  She advised him to look out for any signs of bleeding Yesterday he noticed his stool was black.  He denies any bright red blood in stool No abd pain No weakness/dizziness No vomiting He has never had this before, denies known h/o GI bleed Past Medical History  Diagnosis Date  . Atrial fibrillation   . Hypertension    Past Surgical History  Procedure Laterality Date  . Tonsillectomy     History reviewed. No pertinent family history. History  Substance Use Topics  . Smoking status: Never Smoker   . Smokeless tobacco: Not on file  . Alcohol Use: 1.2 oz/week    2 Glasses of wine per week    Review of Systems  Constitutional: Negative for fever.  Gastrointestinal: Positive for hematochezia. Negative for vomiting, abdominal pain and hematemesis.  Neurological: Negative for dizziness.  All other systems reviewed and are negative.    Allergies  Lactose intolerance (gi)  Home Medications   Current Outpatient Rx  Name  Route  Sig  Dispense  Refill  . amoxicillin-clavulanate (AUGMENTIN) 875-125 MG per tablet   Oral   Take 1 tablet by mouth 2 (two) times daily.         . cetirizine (QC ALL DAY ALLERGY) 10 MG tablet   Oral   Take 10 mg by mouth  daily.         . clindamycin (CLEOCIN) 300 MG capsule   Oral   Take 1 capsule (300 mg total) by mouth 4 (four) times daily.   28 capsule   0   . lisinopril-hydrochlorothiazide (PRINZIDE,ZESTORETIC) 10-12.5 MG per tablet   Oral   Take 1 tablet by mouth daily.         . metoprolol succinate (TOPROL-XL) 50 MG 24 hr tablet   Oral   Take 50 mg by mouth at bedtime.         Marland Kitchen oxyCODONE-acetaminophen (PERCOCET/ROXICET) 5-325 MG per tablet   Oral   Take 2 tablets by mouth every 4 (four) hours as needed for severe pain.   6 tablet   0   . warfarin (COUMADIN) 5 MG tablet   Oral   Take 1 tablet (5 mg total) by mouth daily.   30 tablet   0    BP 143/76  Pulse 68  Temp(Src) 97.7 F (36.5 C) (Oral)  Resp 14  SpO2 97% BP 116/73  Pulse 66  Temp(Src) 97.7 F (36.5 C) (Oral)  Resp 18  SpO2 97%  Physical Exam CONSTITUTIONAL: Well developed/well nourished HEAD: Normocephalic/atraumatic EYES: EOMI/PERRL, conjunctiva pink ENMT: Mucous membranes moist NECK: supple no meningeal signs SPINE:entire spine nontender CV:  no murmurs/rubs/gallops noted LUNGS: Lungs are clear to auscultation bilaterally, no  apparent distress ABDOMEN: soft, nontender, no rebound or guarding Rectal - external hemorrhoid noted.  No mass/abscess noted.  Small amt of stool noted that is brown.  No blood or melena noted.  Faint positive hemoccult GU:no cva tenderness NEURO: Pt is awake/alert, moves all extremitiesx4 EXTREMITIES: pulses normal, full ROM SKIN: warm, color normal PSYCH: no abnormalities of mood noted  ED Course  Procedures (including critical care time) 8:00 AM No signs of anemia His coumadin is now subtherapeutic, but he had held recent doses per his PCP request and now back to his normal dosing My suspicion for acute/emergent GI bleed is low We discussed strict return precautions He has f/u PCP visit this week Labs Review Labs Reviewed  BASIC METABOLIC PANEL - Abnormal; Notable  for the following:    BUN 30 (*)    Creatinine, Ser 1.49 (*)    GFR calc non Af Amer 41 (*)    GFR calc Af Amer 47 (*)    All other components within normal limits  CBC WITH DIFFERENTIAL - Abnormal; Notable for the following:    RBC 4.05 (*)    Hemoglobin 12.7 (*)    HCT 37.2 (*)    Eosinophils Relative 10 (*)    All other components within normal limits  PROTIME-INR   Imaging Review No results found.  EKG Interpretation   None       MDM  No diagnosis found. Nursing notes including past medical history and social history reviewed and considered in documentation Labs/vital reviewed and considered Previous records reviewed and considered - previous labs reviewed     Joya Gaskinsonald W Avedis Bevis, MD 03/19/13 [phone]

## 2013-03-20 LAB — OCCULT BLOOD, POC DEVICE: FECAL OCCULT BLD: NEGATIVE

## 2014-07-15 ENCOUNTER — Encounter (HOSPITAL_COMMUNITY): Payer: Self-pay | Admitting: *Deleted

## 2014-07-15 ENCOUNTER — Emergency Department (HOSPITAL_COMMUNITY)
Admission: EM | Admit: 2014-07-15 | Discharge: 2014-07-15 | Disposition: A | Discharge status: Home / Self Care | Payer: Medicare Other | Attending: Emergency Medicine | Admitting: Emergency Medicine

## 2014-07-15 DIAGNOSIS — Z792 Long term (current) use of antibiotics: Secondary | ICD-10-CM | POA: Insufficient documentation

## 2014-07-15 DIAGNOSIS — Z7901 Long term (current) use of anticoagulants: Secondary | ICD-10-CM | POA: Insufficient documentation

## 2014-07-15 DIAGNOSIS — I10 Essential (primary) hypertension: Secondary | ICD-10-CM | POA: Insufficient documentation

## 2014-07-15 DIAGNOSIS — I4891 Unspecified atrial fibrillation: Secondary | ICD-10-CM | POA: Insufficient documentation

## 2014-07-15 DIAGNOSIS — Z79899 Other long term (current) drug therapy: Secondary | ICD-10-CM | POA: Diagnosis not present

## 2014-07-15 DIAGNOSIS — M79605 Pain in left leg: Secondary | ICD-10-CM | POA: Insufficient documentation

## 2014-07-15 DIAGNOSIS — Z86718 Personal history of other venous thrombosis and embolism: Secondary | ICD-10-CM | POA: Diagnosis not present

## 2014-07-15 DIAGNOSIS — M1990 Unspecified osteoarthritis, unspecified site: Secondary | ICD-10-CM | POA: Insufficient documentation

## 2014-07-15 HISTORY — DX: Acute embolism and thrombosis of unspecified deep veins of unspecified lower extremity: I82.409

## 2014-07-15 HISTORY — DX: Personal history of other drug therapy: Z92.29

## 2014-07-15 HISTORY — DX: Unspecified osteoarthritis, unspecified site: M19.90

## 2014-07-15 LAB — PROTIME-INR
INR: 2.92 — ABNORMAL HIGH (ref 0.00–1.49)
Prothrombin Time: 30 seconds — ABNORMAL HIGH (ref 11.6–15.2)

## 2014-07-15 NOTE — Discharge Instructions (Signed)

## 2014-07-15 NOTE — ED Provider Notes (Signed)
CSN: [id number]     Arrival date & time 07/15/14  1431 History   First MD Initiated Contact with Patient 07/15/14 1520     Chief Complaint  Patient presents with  . Leg Pain    left     (Consider location/radiation/quality/duration/timing/severity/associated sxs/prior Treatment) HPI   89ym with L leg pain. Gradual onset a couple days ago. Aching behind knee. Denies trauma but is pretty active. No swelling. Hx of dvt and on coumadin. Concerned he may have another clot. No respiratory complaints. Reports compliance with meds.   Past Medical History  Diagnosis Date  . Atrial fibrillation   . Hypertension   . DVT (deep venous thrombosis)   . Hx of long term use of blood thinners     coumadin  . Arthritis    Past Surgical History  Procedure Laterality Date  . Tonsillectomy     History reviewed. No pertinent family history. History  Substance Use Topics  . Smoking status: Never Smoker   . Smokeless tobacco: Not on file  . Alcohol Use: 1.2 oz/week    2 Glasses of wine per week    Review of Systems  All systems reviewed and negative, other than as noted in HPI.   Allergies  Lactose intolerance (gi)  Home Medications   Prior to Admission medications   Medication Sig Start Date End Date Taking? Authorizing Provider  amoxicillin-clavulanate (AUGMENTIN) 875-125 MG per tablet Take 1 tablet by mouth 2 (two) times daily.    Historical Provider, MD  cetirizine (QC ALL DAY ALLERGY) 10 MG tablet Take 10 mg by mouth daily.    Historical Provider, MD  clindamycin (CLEOCIN) 300 MG capsule Take 1 capsule (300 mg total) by mouth 4 (four) times daily. 03/09/13   Azalia BilisKevin Campos, MD  lisinopril-hydrochlorothiazide (PRINZIDE,ZESTORETIC) 10-12.5 MG per tablet Take 1 tablet by mouth daily.    Historical Provider, MD  metoprolol succinate (TOPROL-XL) 50 MG 24 hr tablet Take 50 mg by mouth at bedtime. 01/02/13   Historical Provider, MD  oxyCODONE-acetaminophen (PERCOCET/ROXICET) 5-325 MG per  tablet Take 2 tablets by mouth every 4 (four) hours as needed for severe pain. 03/09/13   Azalia BilisKevin Campos, MD  warfarin (COUMADIN) 5 MG tablet Take 1 tablet (5 mg total) by mouth daily. 01/06/13   Gerhard Munchobert Lockwood, MD   BP 157/76 mmHg  Pulse 62  Temp(Src) 98.5 F (36.9 C) (Oral)  Resp 18  SpO2 98% Physical Exam  Constitutional: He appears well-developed and well-nourished. No distress.  HENT:  Head: Normocephalic and atraumatic.  Eyes: Conjunctivae are normal. Right eye exhibits no discharge. Left eye exhibits no discharge.  Neck: Neck supple.  Cardiovascular: Normal rate, regular rhythm and normal heart sounds.  Exam reveals no gallop and no friction rub.   No murmur heard. Pulmonary/Chest: Effort normal and breath sounds normal. No respiratory distress.  Abdominal: Soft. He exhibits no distension. There is no tenderness.  Musculoskeletal: He exhibits no edema or tenderness.  Lower extremities symmetric as compared to each other. No calf tenderness. Negative Homan's. No palpable cords.   Neurological: He is alert.  Skin: Skin is warm and dry.  Psychiatric: He has a normal mood and affect. His behavior is normal. Thought content normal.  Nursing note and vitals reviewed.   ED Course  Procedures (including critical care time) Labs Review Labs Reviewed  PROTIME-INR    Imaging Review No results found.   EKG Interpretation None      MDM   Final diagnoses:  Left leg  pain    89yM with L leg pain. Appears very well for age. Concerned about possible dvt with his history but no exam findings to support this. Also much less likely on coumadin with inr of 2.9. Unfortunately cannot obtain US at this hour. Discussed with pt that can return to ED for study but het would rather fu with his hemtaologist.     Raeford Razor, MD 07/26/14 1435

## 2014-07-15 NOTE — ED Notes (Addendum)
Pt states he has pain in lt leg x 2 days, has hx of DVT. Pt states his lt leg is not swollen, co pain behind lt knee. Pt on chronic blood thinners.

## 2016-05-02 ENCOUNTER — Encounter (HOSPITAL_COMMUNITY): Payer: Self-pay | Admitting: Emergency Medicine

## 2016-05-02 ENCOUNTER — Emergency Department (HOSPITAL_COMMUNITY): Payer: Medicare Other

## 2016-05-02 ENCOUNTER — Emergency Department (HOSPITAL_COMMUNITY)
Admission: EM | Admit: 2016-05-02 | Discharge: 2016-05-02 | Disposition: A | Discharge status: Home / Self Care | Payer: Medicare Other | Attending: Emergency Medicine | Admitting: Emergency Medicine

## 2016-05-02 DIAGNOSIS — I10 Essential (primary) hypertension: Secondary | ICD-10-CM | POA: Insufficient documentation

## 2016-05-02 DIAGNOSIS — R0789 Other chest pain: Secondary | ICD-10-CM | POA: Diagnosis present

## 2016-05-02 DIAGNOSIS — Z79899 Other long term (current) drug therapy: Secondary | ICD-10-CM | POA: Insufficient documentation

## 2016-05-02 DIAGNOSIS — Z7901 Long term (current) use of anticoagulants: Secondary | ICD-10-CM | POA: Diagnosis not present

## 2016-05-02 DIAGNOSIS — R079 Chest pain, unspecified: Secondary | ICD-10-CM

## 2016-05-02 LAB — BASIC METABOLIC PANEL
Anion gap: 9 (ref 5–15)
BUN: 31 mg/dL — AB (ref 6–20)
CO2: 25 mmol/L (ref 22–32)
Calcium: 9.4 mg/dL (ref 8.9–10.3)
Chloride: 103 mmol/L (ref 101–111)
Creatinine, Ser: 1.63 mg/dL — ABNORMAL HIGH (ref 0.61–1.24)
GFR calc non Af Amer: 36 mL/min — ABNORMAL LOW (ref >60)
GFR, EST AFRICAN AMERICAN: 41 mL/min — AB (ref >60)
Glucose, Bld: 87 mg/dL (ref 65–99)
Potassium: 3.3 mmol/L — ABNORMAL LOW (ref 3.5–5.1)
Sodium: 137 mmol/L (ref 135–145)

## 2016-05-02 LAB — CBC
HEMATOCRIT: 41.9 % (ref 39.0–52.0)
Hemoglobin: 14.4 g/dL (ref 13.0–17.0)
MCH: 31.9 pg (ref 26.0–34.0)
MCHC: 34.4 g/dL (ref 30.0–36.0)
MCV: 92.7 fL (ref 78.0–100.0)
Platelets: 271 10*3/uL (ref 150–400)
RBC: 4.52 MIL/uL (ref 4.22–5.81)
RDW: 12.8 % (ref 11.5–15.5)
WBC: 5.5 10*3/uL (ref 4.0–10.5)

## 2016-05-02 LAB — TROPONIN I: Troponin I: <0.03 ng/mL (ref <0.03)

## 2016-05-02 MED ORDER — POTASSIUM CHLORIDE CRYS ER 20 MEQ PO TBCR
40.0000 meq | EXTENDED_RELEASE_TABLET | Freq: Once | ORAL | Status: AC
  Administered 2016-05-02: 40 meq via ORAL
  Filled 2016-05-02: qty 2

## 2016-05-02 NOTE — ED Provider Notes (Signed)
[facility] Provider Note   CSN: [id number] Arrival date & time: 05/02/16  0704     History   Chief Complaint Chief Complaint  Patient presents with  . Chest Pain    HPI Ronald Cooper is a 83 y.o. male.  Patient states that he woke up in the middle the night to go to the bathroom and he felt a small amount of discomfort in his chest. This has disappeared now. He also checked his blood pressure and it was elevated with a diastolic of 170.   The history is provided by the patient. No language interpreter was used.  Chest Pain   This is a new problem. The current episode started less than 1 hour ago. The problem has been resolved. The pain is associated with walking. The pain is present in the lateral region. The pain is at a severity of 3/10. The pain is mild. The quality of the pain is described as brief. The pain does not radiate. Pertinent negatives include no abdominal pain, no back pain, no cough and no headaches.  Pertinent negatives for past medical history include no seizures.    Past Medical History:  Diagnosis Date  . Arthritis   . Atrial fibrillation (HCC)   . DVT (deep venous thrombosis) (HCC)   . Hx of long term use of blood thinners    coumadin  . Hypertension     There are no active problems to display for this patient.   Past Surgical History:  Procedure Laterality Date  . TONSILLECTOMY         Home Medications    Prior to Admission medications   Medication Sig Start Date End Date Taking? Authorizing Provider  amLODipine (NORVASC) 5 MG tablet Take 1 tablet by mouth daily. 04/29/16 04/29/17 Yes Historical Provider, MD  hydrochlorothiazide (MICROZIDE) 12.5 MG capsule Take 1 capsule by mouth daily. 04/29/16 04/29/17 Yes Historical Provider, MD  metoprolol succinate (TOPROL-XL) 50 MG 24 hr tablet Take 1 tablet by mouth daily. 07/05/15  Yes Historical Provider, MD  warfarin (COUMADIN) 5 MG tablet Take 1 tablet by mouth daily. 02/05/16  Yes  Historical Provider, MD  amoxicillin-clavulanate (AUGMENTIN) 875-125 MG per tablet Take 1 tablet by mouth 2 (two) times daily.    Historical Provider, MD  clindamycin (CLEOCIN) 300 MG capsule Take 1 capsule (300 mg total) by mouth 4 (four) times daily. Patient not taking: Reported on 05/02/2016 03/09/13   Azalia BilisKevin Campos, MD  lisinopril-hydrochlorothiazide (PRINZIDE,ZESTORETIC) 10-12.5 MG per tablet Take 1 tablet by mouth daily.    Historical Provider, MD  oxyCODONE-acetaminophen (PERCOCET/ROXICET) 5-325 MG per tablet Take 2 tablets by mouth every 4 (four) hours as needed for severe pain. Patient not taking: Reported on 05/02/2016 03/09/13   Azalia BilisKevin Campos, MD    Family History No family history on file.  Social History Social History  Substance Use Topics  . Smoking status: Never Smoker  . Smokeless tobacco: Never Used  . Alcohol use 1.2 oz/week    2 Glasses of wine per week     Allergies   Lactose intolerance (gi)   Review of Systems Review of Systems  Constitutional: Negative for appetite change and fatigue.  HENT: Negative for congestion, ear discharge and sinus pressure.   Eyes: Negative for discharge.  Respiratory: Negative for cough.   Cardiovascular: Positive for chest pain.  Gastrointestinal: Negative for abdominal pain and diarrhea.  Genitourinary: Negative for frequency and hematuria.  Musculoskeletal: Negative for back pain.  Skin: Negative for rash.  Neurological: Negative for seizures and headaches.  Psychiatric/Behavioral: Negative for hallucinations.     Physical Exam Updated Vital Signs BP 135/69   Pulse (!) 55   Resp 13   Ht 5\' 9"  (1.753 m)   Wt 170 lb (77.1 kg)   SpO2 99%   BMI 25.10 kg/m   Physical Exam  Constitutional: He is oriented to person, place, and time. He appears well-developed.  HENT:  Head: Normocephalic.  Eyes: Conjunctivae and EOM are normal. No scleral icterus.  Neck: Neck supple. No thyromegaly present.  Cardiovascular: Normal rate  and regular rhythm.  Exam reveals no gallop and no friction rub.   No murmur heard. Pulmonary/Chest: No stridor. He has no wheezes. He has no rales. He exhibits no tenderness.  Abdominal: He exhibits no distension. There is no tenderness. There is no rebound.  Musculoskeletal: Normal range of motion. He exhibits no edema.  Lymphadenopathy:    He has no cervical adenopathy.  Neurological: He is oriented to person, place, and time. He exhibits normal muscle tone. Coordination normal.  Skin: No rash noted. No erythema.  Psychiatric: He has a normal mood and affect. His behavior is normal.     ED Treatments / Results  Labs (all labs ordered are listed, but only abnormal results are displayed) Labs Reviewed  BASIC METABOLIC PANEL - Abnormal; Notable for the following:       Result Value   Potassium 3.3 (*)    BUN 31 (*)    Creatinine, Ser 1.63 (*)    GFR calc non Af Amer 36 (*)    GFR calc Af Amer 41 (*)    All other components within normal limits  CBC  TROPONIN I    EKG  EKG Interpretation  Date/Time:  Saturday May 02 2016 07:15:34 EDT Ventricular Rate:  65 PR Interval:    QRS Duration: 112 QT Interval:  418 QTC Calculation: 435 R Axis:   -3 Text Interpretation:  Sinus rhythm Borderline prolonged PR interval Incomplete RBBB and LAFB Confirmed by Stepfanie Yott  MD, Jomarie Longs ([id number]) on 05/02/2016 7:26:33 AM Also confirmed by Sumedha Munnerlyn  MD, Laureano Hetzer [phone])  on 05/02/2016 11:05:32 AM       Radiology Dg Chest 2 View  Result Date: 05/02/2016 CLINICAL DATA:  Chest pain left shoulder pain today. EXAM: CHEST  2 VIEW COMPARISON:  01/05/2013 and 08/18/2008 FINDINGS: Lungs are adequately inflated without focal airspace consolidation or effusion. Cardiomediastinal silhouette is within normal. There is calcified plaque over the aortic arch. There are mild degenerate changes of the spine. IMPRESSION: No active cardiopulmonary disease. Aortic atherosclerosis. Electronically Signed   By: Elberta Fortis  M.D.   On: 05/02/2016 08:22    Procedures Procedures (including critical care time)  Medications Ordered in ED Medications  potassium chloride SA (K-DUR,KLOR-CON) CR tablet 40 mEq (not administered)     Initial Impression / Assessment and Plan / ED Course  I have reviewed the triage vital signs and the nursing notes.  Pertinent labs & imaging results that were available during my care of the patient were reviewed by me and considered in my medical decision making (see chart for details).     Labs EKG chest x-ray unremarkable. Doubt chest discomfort was cardiac related. It only lasted a very short time. He'll follow-up with his PCP  Final Clinical Impressions(s) / ED Diagnoses   Final diagnoses:  Chest pain of uncertain etiology    New Prescriptions New Prescriptions   No medications on file  Bethann Berkshire, MD 05/02/16 1116

## 2016-05-02 NOTE — ED Notes (Signed)
Pt states that when he sleeps on his right side that his arm gets numb and tingly like when it "falls asleep".  I asked pt if he was lying on his arm while sleeping and he stated that he did know if he was or not.

## 2016-05-02 NOTE — ED Triage Notes (Signed)
Pt initially reported that when he woke up, he felt a "dull twinge" in his LT chest. He states that the pain is barely noticeable. States he took his BP was around 173/70 and his pulse was mildly elevated. No SOB or dizziness. Recently changed medications. Hx of A.fib.

## 2016-05-02 NOTE — Discharge Instructions (Signed)
Follow up with your md this week if any problems °

## 2016-08-15 ENCOUNTER — Encounter (HOSPITAL_COMMUNITY): Payer: Self-pay | Admitting: Emergency Medicine

## 2016-08-15 ENCOUNTER — Emergency Department (HOSPITAL_COMMUNITY)
Admission: EM | Admit: 2016-08-15 | Discharge: 2016-08-15 | Disposition: A | Discharge status: Home Health | Payer: Medicare Other | Attending: Emergency Medicine | Admitting: Emergency Medicine

## 2016-08-15 DIAGNOSIS — D6832 Hemorrhagic disorder due to extrinsic circulating anticoagulants: Secondary | ICD-10-CM | POA: Insufficient documentation

## 2016-08-15 DIAGNOSIS — I10 Essential (primary) hypertension: Secondary | ICD-10-CM | POA: Insufficient documentation

## 2016-08-15 DIAGNOSIS — T45515A Adverse effect of anticoagulants, initial encounter: Secondary | ICD-10-CM | POA: Insufficient documentation

## 2016-08-15 DIAGNOSIS — Z79899 Other long term (current) drug therapy: Secondary | ICD-10-CM | POA: Diagnosis not present

## 2016-08-15 DIAGNOSIS — Z7901 Long term (current) use of anticoagulants: Secondary | ICD-10-CM | POA: Insufficient documentation

## 2016-08-15 DIAGNOSIS — I480 Paroxysmal atrial fibrillation: Secondary | ICD-10-CM | POA: Insufficient documentation

## 2016-08-15 LAB — PROTIME-INR
INR: 2.46
Prothrombin Time: 27.1 seconds — ABNORMAL HIGH (ref 11.4–15.2)

## 2016-08-15 MED ORDER — METOPROLOL TARTRATE 5 MG/5ML IV SOLN
5.0000 mg | Freq: Once | INTRAVENOUS | Status: AC
  Administered 2016-08-15: 5 mg via INTRAVENOUS
  Filled 2016-08-15: qty 5

## 2016-08-15 NOTE — ED Notes (Signed)
Wife came to nurses station stating pt needs to use the restroom.  Went to give patient a urinal, pt states "I just need you to unhook me".  Informed pt that he should not be up walking around since he is here for heart related issues.  Pt states "I know what I am talking about, I have done this before, you just need to unhook me so I can walk to the bathroom".  Pt ambulated to bathroom

## 2016-08-15 NOTE — Discharge Instructions (Signed)
Continue your medications. Call your cardiologist's office on Monday to get seen this week. Return to the ED if you get symptoms from your atrial fibrillation such as chest pain, shortness of breath, feeling light headed or dizzy.   Your INR was 2.5 today.

## 2016-08-15 NOTE — ED Triage Notes (Signed)
PT states when he woke up this morning he was having high blood pressure and his pulse felt weak and irregular.  Pt has a history of AFib

## 2016-08-15 NOTE — ED Provider Notes (Signed)
[facility] Provider Note   CSN: [id number] Arrival date & time: 08/15/16  4098  Time seen 05:45 AM   History   Chief Complaint Chief Complaint  Patient presents with  . Hypertension    HPI Ronald Cooper is a 83 y.o. male.  HPI  patient has a history of paroxysmal atrial fibrillation. He states he woke up at 3:30 this morning with a "light attack". He denies chest pain, shortness of breath, diaphoresis, nausea, vomiting, feeling lightheaded, feeling dizzy. He states when he took his blood pressure at home it was high and his pulse felt weaker than normal. He states his cardiologist is with [facility]. He has been on Coumadin.  PCP Eartha Inch, MD Cardiology [facility] in [place], he cannot recall his name  Past Medical History:  Diagnosis Date  . Arthritis   . Atrial fibrillation (HCC)   . DVT (deep venous thrombosis) (HCC)   . Hx of long term use of blood thinners    coumadin  . Hypertension     There are no active problems to display for this patient.   Past Surgical History:  Procedure Laterality Date  . TONSILLECTOMY         Home Medications    Patient states he no longer taking the lisinopril   Prior to Admission medications   Medication Sig Start Date End Date Taking? Authorizing Provider  amLODipine (NORVASC) 5 MG tablet Take 1 tablet by mouth daily. 04/29/16 04/29/17  [provider]  amoxicillin-clavulanate (AUGMENTIN) 875-125 MG per tablet Take 1 tablet by mouth 2 (two) times daily.    [provider]  clindamycin (CLEOCIN) 300 MG capsule Take 1 capsule (300 mg total) by mouth 4 (four) times daily. Patient not taking: Reported on 05/02/2016 03/09/13   Azalia Bilis, MD  hydrochlorothiazide (MICROZIDE) 12.5 MG capsule Take 1 capsule by mouth daily. 04/29/16 04/29/17  [provider]  lisinopril-hydrochlorothiazide (PRINZIDE,ZESTORETIC) 10-12.5 MG per tablet Take 1 tablet by mouth daily.    [provider]    metoprolol succinate (TOPROL-XL) 50 MG 24 hr tablet Take 1 tablet by mouth daily. 07/05/15   [provider]  oxyCODONE-acetaminophen (PERCOCET/ROXICET) 5-325 MG per tablet Take 2 tablets by mouth every 4 (four) hours as needed for severe pain. Patient not taking: Reported on 05/02/2016 03/09/13   Azalia Bilis, MD  warfarin (COUMADIN) 5 MG tablet Take 1 tablet by mouth daily. 02/05/16   [provider]    Family History No family history on file.  Social History Social History  Substance Use Topics  . Smoking status: Never Smoker  . Smokeless tobacco: Never Used  . Alcohol use 1.2 oz/week    2 Glasses of wine per week  lives at home Lives with spouse   Allergies   Lactose intolerance (gi)   Review of Systems Review of Systems  All other systems reviewed and are negative.    Physical Exam Updated Vital Signs BP (!) 147/92 (BP Location: Left Arm)   Pulse 81   Temp 97.6 F (36.4 C) (Oral)   Resp 12   Ht 5\' 9"  (1.753 m)   Wt 79.4 kg (175 lb)   SpO2 97%   BMI 25.84 kg/m   Vital signs normal    Physical Exam  Constitutional: He is oriented to person, place, and time. He appears well-developed and well-nourished.  Non-toxic appearance. He does not appear ill. No distress.  HENT:  Head: Normocephalic and atraumatic.  Right Ear: External ear normal.  Left Ear: External ear normal.  Nose: Nose normal. No mucosal edema or rhinorrhea.  Mouth/Throat: Oropharynx is clear and moist and mucous membranes are normal. No dental abscesses or uvula swelling.  Eyes: Conjunctivae and EOM are normal. Pupils are equal, round, and reactive to light.  Neck: Normal range of motion and full passive range of motion without pain. Neck supple.  Cardiovascular: Normal rate and normal heart sounds.  An irregularly irregular rhythm present. Exam reveals no gallop and no friction rub.   No murmur heard. Pulmonary/Chest: Effort normal and breath sounds normal. No respiratory  distress. He has no wheezes. He has no rhonchi. He has no rales. He exhibits no tenderness and no crepitus.  Abdominal: Soft. Normal appearance and bowel sounds are normal. He exhibits no distension. There is no tenderness. There is no rebound and no guarding.  Musculoskeletal: Normal range of motion. He exhibits no edema or tenderness.  Moves all extremities well.   Neurological: He is alert and oriented to person, place, and time. He has normal strength. No cranial nerve deficit.  Skin: Skin is warm, dry and intact. No rash noted. No erythema. No pallor.  Psychiatric: He has a normal mood and affect. His speech is normal and behavior is normal. His mood appears not anxious.  Nursing note and vitals reviewed.    ED Treatments / Results  Labs (all labs ordered are listed, but only abnormal results are displayed) Results for orders placed or performed during the hospital encounter of 08/15/16  Protime-INR  Result Value Ref Range   Prothrombin Time 27.1 (H) 11.4 - 15.2 seconds   INR 2.46      Laboratory interpretation all normal except therapuetic INR    EKG  EKG Interpretation  Date/Time:  Saturday August 15 2016 04:53:23 EDT Ventricular Rate:  104 PR Interval:    QRS Duration: 99 QT Interval:  377 QTC Calculation: 496 R Axis:   -29 Text Interpretation:  Atrial fibrillation Incomplete right bundle branch block Left anterior fasicular block Borderline prolonged QT interval Since last tracing 02 May 2016 Normal sinus rhythm has been replaced by atrial fibrillation Reconfirmed by Devoria AlbeKnapp, Niang Mitcheltree ([id number]) on 08/15/2016 5:57:01 AM       Radiology No results found.  Procedures Procedures (including critical care time)  Medications Ordered in ED Medications  metoprolol tartrate (LOPRESSOR) injection 5 mg (5 mg Intravenous Given 08/15/16 0620)     Initial Impression / Assessment and Plan / ED Course  I have reviewed the triage vital signs and the nursing notes.  Pertinent labs &  imaging results that were available during my care of the patient were reviewed by me and considered in my medical decision making (see chart for details).  At time of my exam his blood pressure was 130/77 and his heart rate varied from 75-100. He was given IV metoprolol. We are currently having a shortage of IV diltiazem.   Review of his [facility] record shows he was last seen by his cardiologist in July 2017. At that time he had not had episode of atrial fibrillation in a while. He was continued on his medication. He also notes patient has factor V Leiden and is on chronic anticoagulation.  07:27 AM Dr Rennis GoldenHilty, cardiology, discussed patient, he is basically asymptomatic and is already anticoagulated. Feels he could go home and f/u with his cardiologist this week.   Recheck 07:35 AM HR 71-80, BP 128/76. Patient states his last INR was about 3 weeks ago. He states he normally  runs 2.4-2.7. I would do a INR this morning however if he is therapeutic home to this plan and will follow up with his cardiologist this week. We discussed reasons to return to the ER such as being symptomatic from his atrial fibrillation.  Final Clinical Impressions(s) / ED Diagnoses   Final diagnoses:  Paroxysmal atrial fibrillation (HCC)  Warfarin-induced coagulopathy [facility])    Plan discharge  Devoria Albe, MD, Concha Pyo, MD 08/15/16 [phone]

## 2020-11-05 ENCOUNTER — Emergency Department (HOSPITAL_BASED_OUTPATIENT_CLINIC_OR_DEPARTMENT_OTHER): Payer: Medicare Other

## 2020-11-05 ENCOUNTER — Emergency Department (HOSPITAL_BASED_OUTPATIENT_CLINIC_OR_DEPARTMENT_OTHER)
Admission: EM | Admit: 2020-11-05 | Discharge: 2020-11-05 | Disposition: A | Discharge status: Home / Self Care | Payer: Medicare Other | Attending: Emergency Medicine | Admitting: Emergency Medicine

## 2020-11-05 ENCOUNTER — Encounter (HOSPITAL_BASED_OUTPATIENT_CLINIC_OR_DEPARTMENT_OTHER): Payer: Self-pay

## 2020-11-05 ENCOUNTER — Other Ambulatory Visit: Payer: Self-pay

## 2020-11-05 DIAGNOSIS — Z79899 Other long term (current) drug therapy: Secondary | ICD-10-CM | POA: Diagnosis not present

## 2020-11-05 DIAGNOSIS — I4891 Unspecified atrial fibrillation: Secondary | ICD-10-CM

## 2020-11-05 DIAGNOSIS — I10 Essential (primary) hypertension: Secondary | ICD-10-CM | POA: Diagnosis not present

## 2020-11-05 DIAGNOSIS — Z7901 Long term (current) use of anticoagulants: Secondary | ICD-10-CM

## 2020-11-05 DIAGNOSIS — R002 Palpitations: Secondary | ICD-10-CM | POA: Diagnosis present

## 2020-11-05 LAB — CBC
HCT: 36.4 % — ABNORMAL LOW (ref 39.0–52.0)
Hemoglobin: 12 g/dL — ABNORMAL LOW (ref 13.0–17.0)
MCH: 31.4 pg (ref 26.0–34.0)
MCHC: 33 g/dL (ref 30.0–36.0)
MCV: 95.3 fL (ref 80.0–100.0)
Platelets: 246 10*3/uL (ref 150–400)
RBC: 3.82 MIL/uL — ABNORMAL LOW (ref 4.22–5.81)
RDW: 13 % (ref 11.5–15.5)
WBC: 5.9 10*3/uL (ref 4.0–10.5)
nRBC: 0 % (ref 0.0–0.2)

## 2020-11-05 LAB — BASIC METABOLIC PANEL
Anion gap: 12 (ref 5–15)
BUN: 41 mg/dL — ABNORMAL HIGH (ref 8–23)
CO2: 18 mmol/L — ABNORMAL LOW (ref 22–32)
Calcium: 9.5 mg/dL (ref 8.9–10.3)
Chloride: 110 mmol/L (ref 98–111)
Creatinine, Ser: 1.88 mg/dL — ABNORMAL HIGH (ref 0.61–1.24)
GFR, Estimated: 33 mL/min — ABNORMAL LOW (ref >60)
Glucose, Bld: 96 mg/dL (ref 70–99)
Potassium: 4 mmol/L (ref 3.5–5.1)
Sodium: 140 mmol/L (ref 135–145)

## 2020-11-05 LAB — MAGNESIUM: Magnesium: 1.9 mg/dL (ref 1.7–2.4)

## 2020-11-05 LAB — PROTIME-INR
INR: 2.6 — ABNORMAL HIGH (ref 0.8–1.2)
Prothrombin Time: 27.4 seconds — ABNORMAL HIGH (ref 11.4–15.2)

## 2020-11-05 MED ORDER — DILTIAZEM HCL-DEXTROSE 125-5 MG/125ML-% IV SOLN (PREMIX)
5.0000 mg/h | INTRAVENOUS | Status: DC
Start: 2020-11-05 — End: 2020-11-06
  Administered 2020-11-05: 5 mg/h via INTRAVENOUS
  Filled 2020-11-05: qty 125

## 2020-11-05 MED ORDER — DILTIAZEM LOAD VIA INFUSION
20.0000 mg | Freq: Once | INTRAVENOUS | Status: AC
  Administered 2020-11-05: 20 mg via INTRAVENOUS
  Filled 2020-11-05: qty 20

## 2020-11-05 NOTE — ED Provider Notes (Signed)
[facility] EMERGENCY DEPT Provider Note   CSN: [id number] Arrival date & time: 11/05/20  2043     History Chief Complaint  Patient presents with   Palpitations    Ronald Cooper is a 87 y.o. male.  Pt presents to the ED today with palpitations.  Pt has a hx of paroxysmal afib and factor V Leiden deficiency.  He is on coumadin.  Pt said his HR has been irregular and fast for the past few days.  BP also elevated at home.  Pt denies any cp or sob.  He has never been shocked back into NSR.      Past Medical History:  Diagnosis Date   Arthritis    Atrial fibrillation (HCC)    DVT (deep venous thrombosis) (HCC)    Hx of long term use of blood thinners    coumadin   Hypertension     There are no problems to display for this patient.   Past Surgical History:  Procedure Laterality Date   TONSILLECTOMY         History reviewed. No pertinent family history.  Social History   Tobacco Use   Smoking status: Never   Smokeless tobacco: Never  Vaping Use   Vaping Use: Never used  Substance Use Topics   Alcohol use: Yes    Alcohol/week: 2.0 standard drinks    Types: 2 Glasses of wine per week   Drug use: No    Home Medications Prior to Admission medications   Medication Sig Start Date End Date Taking? Authorizing Provider  amLODipine (NORVASC) 5 MG tablet Take 1 tablet by mouth daily. 04/29/16 04/29/17  [provider]  amoxicillin-clavulanate (AUGMENTIN) 875-125 MG per tablet Take 1 tablet by mouth 2 (two) times daily.    [provider]  clindamycin (CLEOCIN) 300 MG capsule Take 1 capsule (300 mg total) by mouth 4 (four) times daily. Patient not taking: Reported on 05/02/2016 03/09/13   Azalia Bilis, MD  hydrochlorothiazide (MICROZIDE) 12.5 MG capsule Take 1 capsule by mouth daily. 04/29/16 04/29/17  [provider]  lisinopril-hydrochlorothiazide (PRINZIDE,ZESTORETIC) 10-12.5 MG per tablet Take 1 tablet by mouth daily.     [provider]  metoprolol succinate (TOPROL-XL) 50 MG 24 hr tablet Take 1 tablet by mouth daily. 07/05/15   [provider]  oxyCODONE-acetaminophen (PERCOCET/ROXICET) 5-325 MG per tablet Take 2 tablets by mouth every 4 (four) hours as needed for severe pain. Patient not taking: Reported on 05/02/2016 03/09/13   Azalia Bilis, MD  warfarin (COUMADIN) 5 MG tablet Take 1 tablet by mouth daily. 02/05/16   [provider]    Allergies    Lactose intolerance (gi)  Review of Systems   Review of Systems  Cardiovascular:  Positive for palpitations.  All other systems reviewed and are negative.  Physical Exam Updated Vital Signs BP 110/61   Pulse (!) 58   Temp 98.3 F (36.8 C) (Oral)   Resp 13   Ht 5\' 9"  (1.753 m)   Wt 48.5 kg   SpO2 97%   BMI 15.80 kg/m   Physical Exam Vitals and nursing note reviewed.  Constitutional:      Appearance: Normal appearance.  HENT:     Head: Normocephalic and atraumatic.     Right Ear: External ear normal.     Left Ear: External ear normal.     Nose: Nose normal.     Mouth/Throat:     Mouth: Mucous membranes are moist.     Pharynx:  Oropharynx is clear.  Eyes:     Extraocular Movements: Extraocular movements intact.     Conjunctiva/sclera: Conjunctivae normal.     Pupils: Pupils are equal, round, and reactive to light.  Cardiovascular:     Rate and Rhythm: Tachycardia present. Rhythm irregular.     Pulses: Normal pulses.     Heart sounds: Normal heart sounds.  Pulmonary:     Effort: Pulmonary effort is normal.     Breath sounds: Normal breath sounds.  Abdominal:     General: Abdomen is flat. Bowel sounds are normal.     Palpations: Abdomen is soft.  Musculoskeletal:        General: Normal range of motion.     Cervical back: Normal range of motion.  Skin:    General: Skin is warm.     Capillary Refill: Capillary refill takes less than 2 seconds.  Neurological:     General: No focal deficit present.     Mental  Status: He is alert and oriented to person, place, and time.  Psychiatric:        Mood and Affect: Mood normal.        Behavior: Behavior normal.    ED Results / Procedures / Treatments   Labs (all labs ordered are listed, but only abnormal results are displayed) Labs Reviewed  BASIC METABOLIC PANEL - Abnormal; Notable for the following components:      Result Value   CO2 18 (*)    BUN 41 (*)    Creatinine, Ser 1.88 (*)    GFR, Estimated 33 (*)    All other components within normal limits  CBC - Abnormal; Notable for the following components:   RBC 3.82 (*)    Hemoglobin 12.0 (*)    HCT 36.4 (*)    All other components within normal limits  PROTIME-INR - Abnormal; Notable for the following components:   Prothrombin Time 27.4 (*)    INR 2.6 (*)    All other components within normal limits  MAGNESIUM  TSH    EKG EKG Interpretation  Date/Time:  Tuesday November 05 2020 21:05:07 EDT Ventricular Rate:  108 PR Interval:    QRS Duration: 96 QT Interval:  298 QTC Calculation: 399 R Axis:   193 Text Interpretation: Atrial fibrillation with rapid ventricular response Lateral infarct , age undetermined Inferior-posterior infarct , age undetermined Abnormal ECG No significant change since last tracing Confirmed by Jacalyn Lefevre [phone]) on 11/05/2020 9:22:19 PM  Radiology DG Chest Port 1 View  Result Date: 11/05/2020 CLINICAL DATA:  Atrial fibrillation. EXAM: PORTABLE CHEST 1 VIEW COMPARISON:  Chest radiograph dated 05/02/2016. FINDINGS: No focal consolidation, pleural effusion, or pneumothorax. The cardiac silhouette is within limits. Atherosclerotic calcification of. Degenerative changes of the spine. No acute pathology. IMPRESSION: No active disease. Electronically Signed   By: Elgie Collard M.D.   On: 11/05/2020 21:44    Procedures Procedures   Medications Ordered in ED Medications  diltiazem (CARDIZEM) 1 mg/mL load via infusion 20 mg (20 mg Intravenous Bolus from Bag  11/05/20 2155)    And  diltiazem (CARDIZEM) 125 mg in dextrose 5% 125 mL (1 mg/mL) infusion (5 mg/hr Intravenous New Bag/Given 11/05/20 2155)    ED Course  I have reviewed the triage vital signs and the nursing notes.  Pertinent labs & imaging results that were available during my care of the patient were reviewed by me and considered in my medical decision making (see chart for details).    MDM Rules/Calculators/A&P  Pt did not want a cardioversion tonight.  He said IV medicine usually works and he wanted to try that first.  Pt given a 20 mg cardizem bolus then put on a drip. Pt is back in sinus now.  HR in the mid-50s which is pt's normal HR.  He feels much better and is ready to go home.  He knows to return if worse.  F/u with cards.  CHA2DS2/VAS Stroke Risk Points: 3   CRITICAL CARE Performed by: Jacalyn Lefevre   Total critical care time: 30 minutes  Critical care time was exclusive of separately billable procedures and treating other patients.  Critical care was necessary to treat or prevent imminent or life-threatening deterioration.  Critical care was time spent personally by me on the following activities: development of treatment plan with patient and/or surrogate as well as nursing, discussions with consultants, evaluation of patient's response to treatment, examination of patient, obtaining history from patient or surrogate, ordering and performing treatments and interventions, ordering and review of laboratory studies, ordering and review of radiographic studies, pulse oximetry and re-evaluation of patient's condition.  Final Clinical Impression(s) / ED Diagnoses Final diagnoses:  Atrial fibrillation with rapid ventricular response (HCC)  Anticoagulated on Coumadin    Rx / DC Orders ED Discharge Orders     None        Jacalyn Lefevre, MD 11/05/20 2322

## 2020-11-05 NOTE — ED Notes (Signed)
Pt's HR is on <60s. Pt is alert and oriented. Denies any complaints. Informed Dr. Particia Nearing and ordered to  dc the cardizem drip. Drip has been discontinued

## 2020-11-05 NOTE — ED Triage Notes (Signed)
Pt states has hx of afib and his HR has been irregular since yesterday. Aslos endorses his BP was elevated at home  Alert and oriented. Came in ambulatory

## 2020-11-06 LAB — TSH: TSH: 2.108 u[IU]/mL (ref 0.350–4.500)

## 2021-08-21 ENCOUNTER — Other Ambulatory Visit (HOSPITAL_BASED_OUTPATIENT_CLINIC_OR_DEPARTMENT_OTHER): Payer: Self-pay

## 2022-02-16 IMAGING — DX DG CHEST 1V PORT
1 series · 2 of 2 positions shown · non-contrast
Comparison: Chest radiograph dated 05/02/2016.

CLINICAL DATA: Atrial fibrillation.

EXAM:
PORTABLE CHEST 1 VIEW

[Series 1: chest · 0.14mm/px · 2 of 2 slices shown]
[im 1/2]
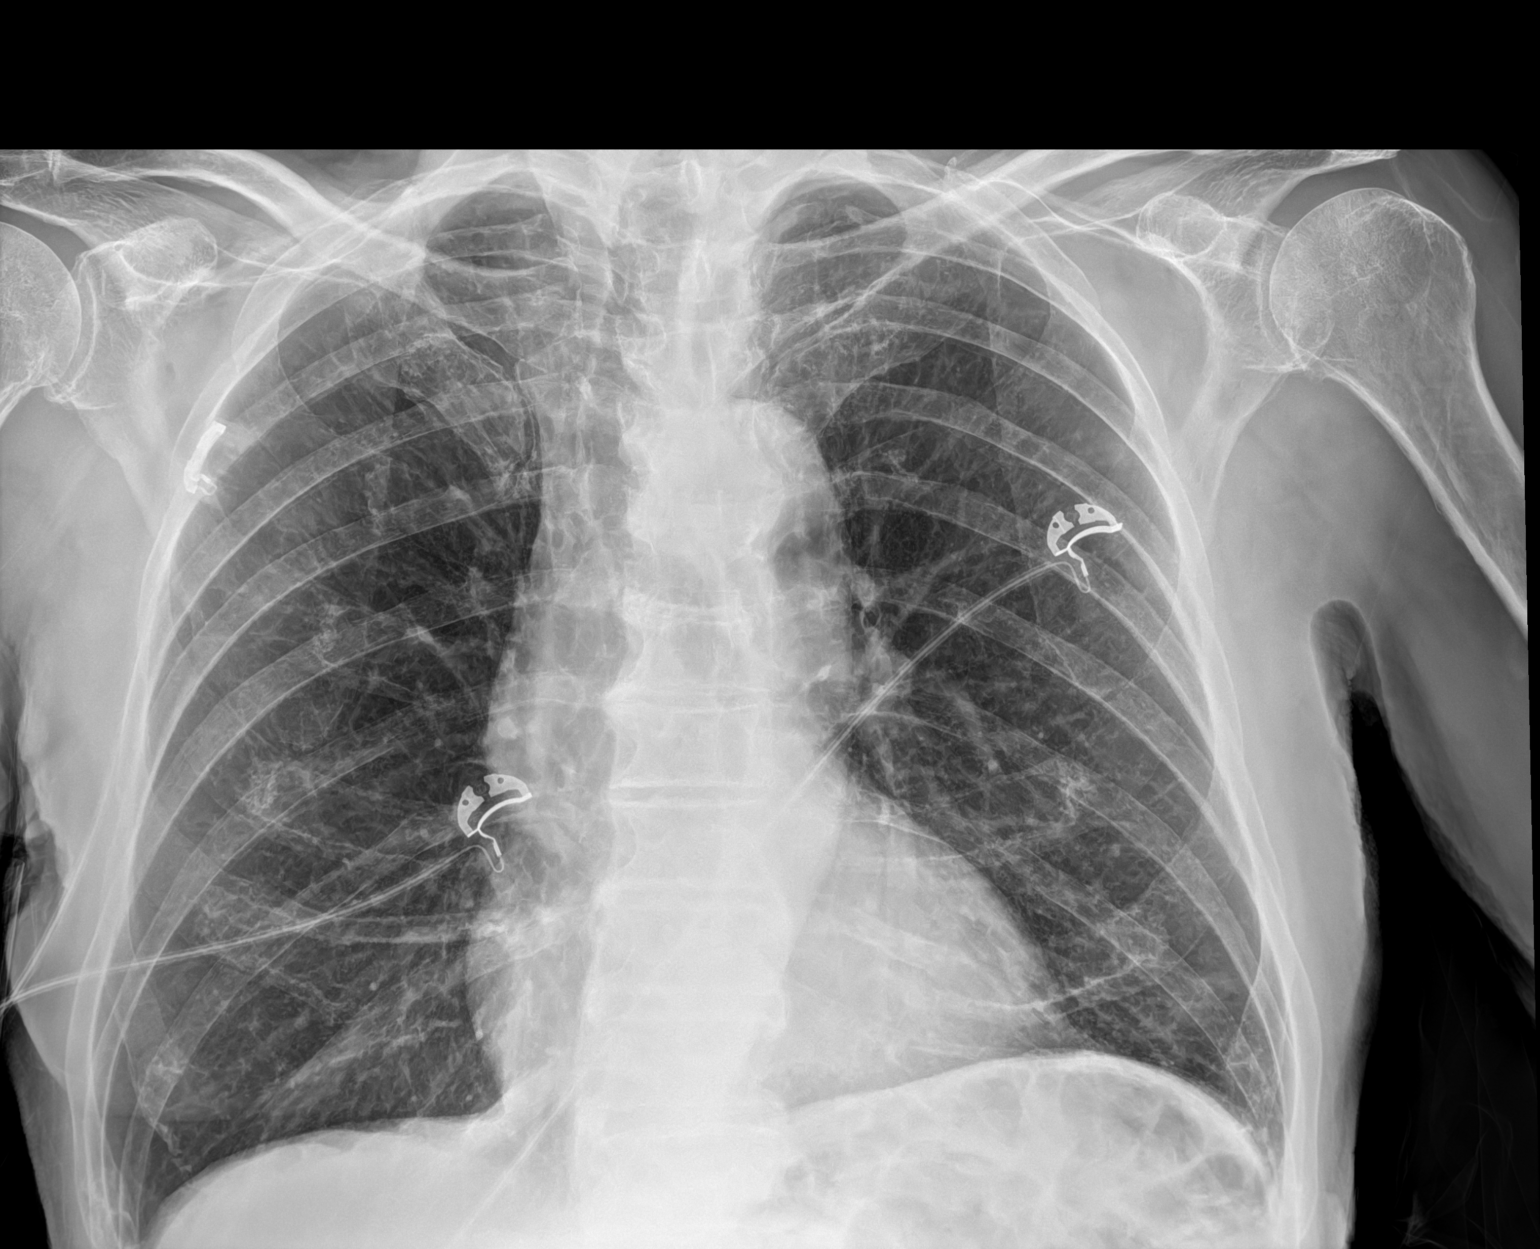
[im 2/2]
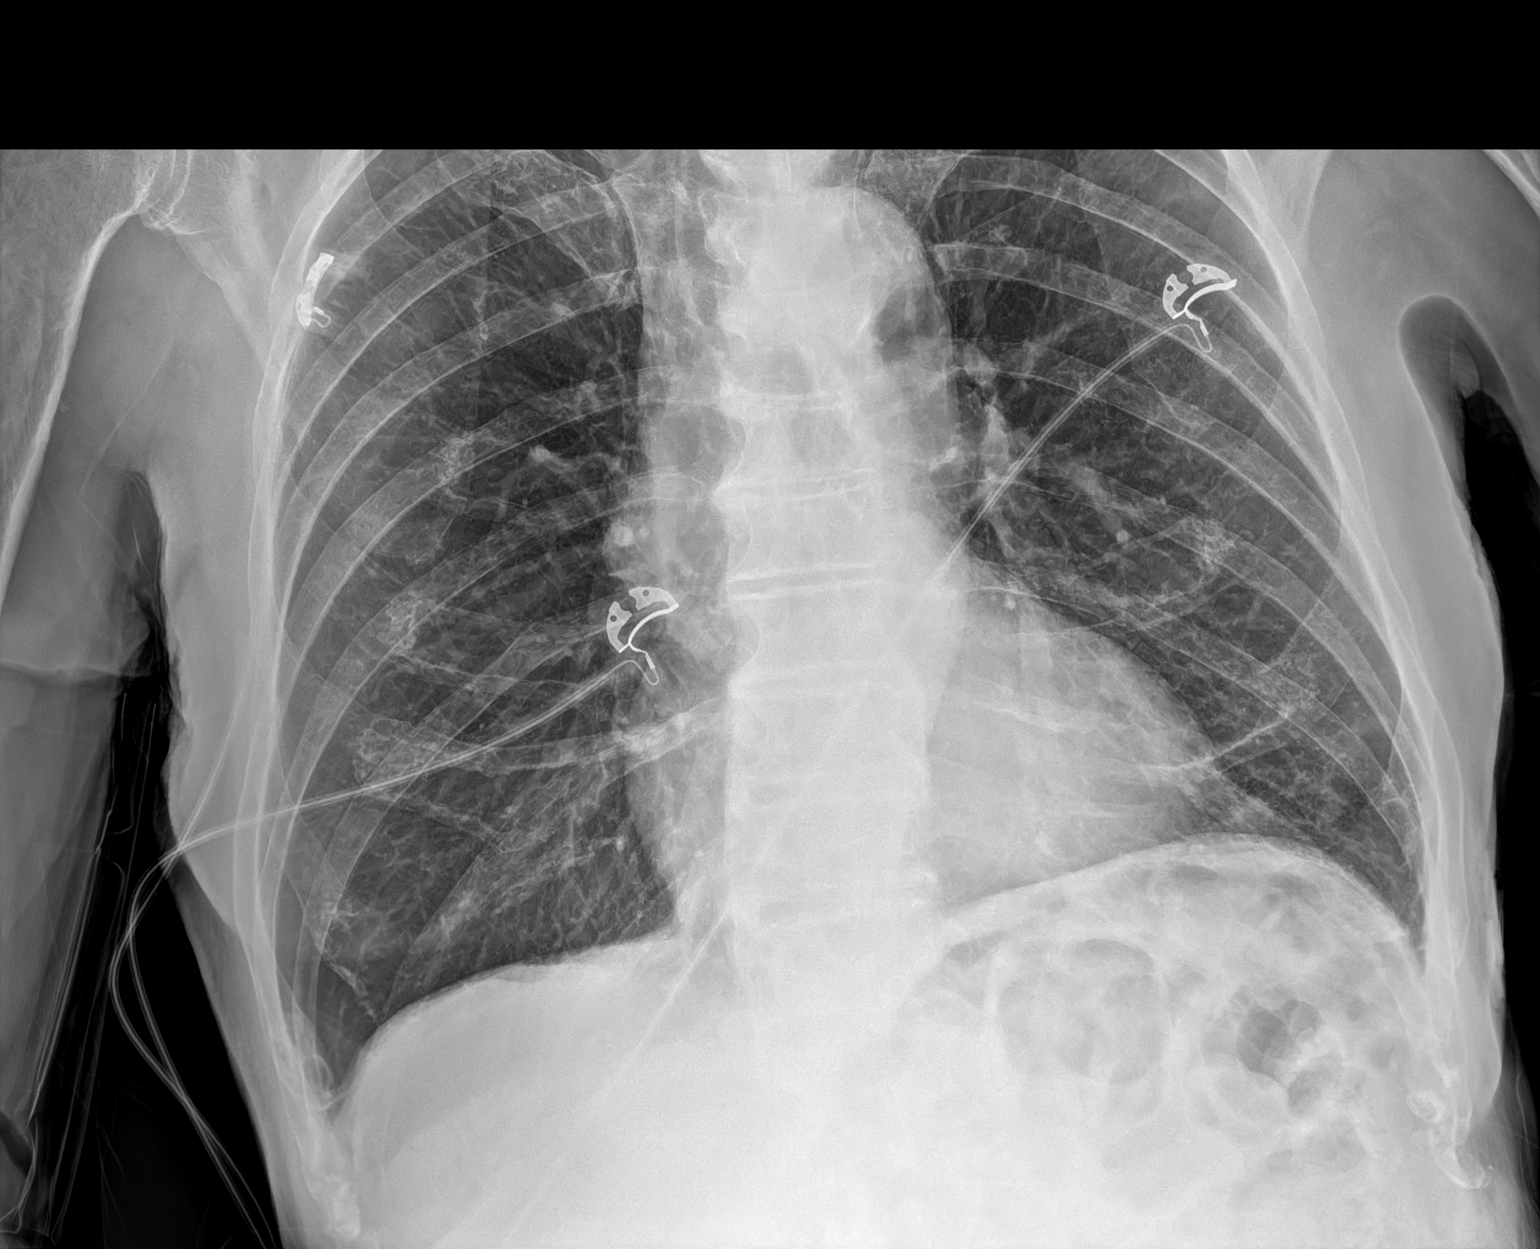

[2 of 2 positions shown; findings below may reference images not displayed]

FINDINGS: No focal consolidation, pleural effusion, or pneumothorax. The
cardiac silhouette is within limits. Atherosclerotic calcification
of. Degenerative changes of the spine. No acute pathology.
IMPRESSION: No active disease.

## 2022-10-29 ENCOUNTER — Other Ambulatory Visit: Payer: Self-pay

## 2022-10-29 ENCOUNTER — Emergency Department (HOSPITAL_COMMUNITY)
Admission: EM | Admit: 2022-10-29 | Discharge: 2022-10-29 | Disposition: A | Discharge status: Home / Self Care | Payer: Medicare Other | Attending: Emergency Medicine | Admitting: Emergency Medicine

## 2022-10-29 DIAGNOSIS — Z5321 Procedure and treatment not carried out due to patient leaving prior to being seen by health care provider: Secondary | ICD-10-CM | POA: Diagnosis not present

## 2022-10-29 DIAGNOSIS — R000 Tachycardia, unspecified: Secondary | ICD-10-CM | POA: Insufficient documentation

## 2022-10-29 NOTE — ED Triage Notes (Signed)
AFIB HR 90-110 Not SOB or CP  IV placed by EMS Pt stated he checks his vitals at home and noticed HR was higher than normal and called 911. No complaints at this time

## 2024-01-29 ENCOUNTER — Emergency Department (HOSPITAL_COMMUNITY)

## 2024-01-29 ENCOUNTER — Other Ambulatory Visit: Payer: Self-pay

## 2024-01-29 ENCOUNTER — Emergency Department (HOSPITAL_COMMUNITY)
Admission: EM | Admit: 2024-01-29 | Discharge: 2024-01-29 | Disposition: A | Discharge status: Home / Self Care | Attending: Emergency Medicine | Admitting: Emergency Medicine

## 2024-01-29 DIAGNOSIS — S0990XA Unspecified injury of head, initial encounter: Secondary | ICD-10-CM

## 2024-01-29 DIAGNOSIS — Y92009 Unspecified place in unspecified non-institutional (private) residence as the place of occurrence of the external cause: Secondary | ICD-10-CM | POA: Insufficient documentation

## 2024-01-29 DIAGNOSIS — W07XXXA Fall from chair, initial encounter: Secondary | ICD-10-CM | POA: Insufficient documentation

## 2024-01-29 DIAGNOSIS — Z7901 Long term (current) use of anticoagulants: Secondary | ICD-10-CM | POA: Insufficient documentation

## 2024-01-29 DIAGNOSIS — S0083XA Contusion of other part of head, initial encounter: Secondary | ICD-10-CM | POA: Insufficient documentation

## 2024-01-29 MED ORDER — BACITRACIN ZINC 500 UNIT/GM EX OINT
TOPICAL_OINTMENT | Freq: Two times a day (BID) | CUTANEOUS | Status: DC
  Administered 2024-01-29: 1 via TOPICAL
  Filled 2024-01-29: qty 1.8

## 2024-01-29 MED ORDER — BACITRACIN ZINC 500 UNIT/GM EX OINT: TOPICAL_OINTMENT | Freq: Two times a day (BID) | CUTANEOUS | Status: DC

## 2024-01-29 NOTE — ED Triage Notes (Signed)
 Pt arrived via [facility] after falling forward out of his chair after falling asleep at his home. Tiny amount of blood present to R temple area. Pt IS ON blood thinners and is wanting head CT d/t him being on blood thinners. Pt is alert and responsive; no slurred speech or vision troubles.

## 2024-01-29 NOTE — ED Provider Notes (Signed)
 [place] EMERGENCY DEPARTMENT AT [facility] Provider Note   CSN: [id number] Arrival date & time: 01/29/24  2152     Patient presents with: Ronald Cooper Ronald Cooper is a 89 y.o. male.    Fall  This patient is a 89 year old male who has factor V Leiden deficiency who is currently anticoagulated on warfarin, he presents to the hospital today after having a slip and fall, he was sitting on a stool in his house when he fell forward and bumped the right temporal part of his head.  Who comes in by EMS after falling out of a chair, he found the neighbor to come and take care of his wife while he was gone.  There was only a small amount of blood on the right temple, he denies any other complaints including nausea vomiting seizures shortness of breath chest pain arm pain leg pain back pain neck pain.  He had a similar injury in the past when he was told that he had a possible cervical spine fracture that was from an old fall off a horse, otherwise the patient is not having any complaints and states this was a mechanical fall when he had fallen asleep on the chair     Prior to Admission medications  Medication Sig Start Date End Date Taking? Authorizing Provider  amLODipine (NORVASC) 5 MG tablet Take 1 tablet by mouth daily. 04/29/16 04/29/17  [provider]  amoxicillin-clavulanate (AUGMENTIN) 875-125 MG per tablet Take 1 tablet by mouth 2 (two) times daily.    [provider]  clindamycin  (CLEOCIN ) 300 MG capsule Take 1 capsule (300 mg total) by mouth 4 (four) times daily. Patient not taking: Reported on 05/02/2016 03/09/13   Baxter Drivers, MD  hydrochlorothiazide (MICROZIDE) 12.5 MG capsule Take 1 capsule by mouth daily. 04/29/16 04/29/17  [provider]  lisinopril-hydrochlorothiazide (PRINZIDE,ZESTORETIC) 10-12.5 MG per tablet Take 1 tablet by mouth daily.    [provider]  metoprolol  succinate (TOPROL -XL) 50 MG 24 hr tablet Take 1 tablet by mouth  daily. 07/05/15   [provider]  oxyCODONE -acetaminophen  (PERCOCET/ROXICET) 5-325 MG per tablet Take 2 tablets by mouth every 4 (four) hours as needed for severe pain. Patient not taking: Reported on 05/02/2016 03/09/13   Campos, Kevin, MD  warfarin (COUMADIN ) 5 MG tablet Take 1 tablet by mouth daily. 02/05/16   [provider]    Allergies: Lactose intolerance (gi)    Review of Systems  All other systems reviewed and are negative.   Updated Vital Signs BP (!) 130/54   Pulse (!) 57   Temp 98 F (36.7 C) (Oral)   Resp 18   Ht 1.753 m (5' 9)   Wt 59 kg   SpO2 97%   BMI 19.20 kg/m   Physical Exam Vitals and nursing note reviewed.  Constitutional:      General: He is not in acute distress.    Appearance: He is well-developed.  HENT:     Head: Normocephalic.     Comments: Subcentimeter small hematoma to the right temporal region    Mouth/Throat:     Pharynx: No oropharyngeal exudate.  Eyes:     General: No scleral icterus.       Right eye: No discharge.        Left eye: No discharge.     Conjunctiva/sclera: Conjunctivae normal.     Pupils: Pupils are equal, round, and reactive to light.  Neck:     Thyroid : No thyromegaly.  Vascular: No JVD.  Cardiovascular:     Rate and Rhythm: Normal rate and regular rhythm.     Heart sounds: Normal heart sounds. No murmur heard.    No friction rub. No gallop.  Pulmonary:     Effort: Pulmonary effort is normal. No respiratory distress.     Breath sounds: Normal breath sounds. No wheezing or rales.  Abdominal:     General: Bowel sounds are normal. There is no distension.     Palpations: Abdomen is soft. There is no mass.     Tenderness: There is no abdominal tenderness.  Musculoskeletal:        General: No tenderness or signs of injury. Normal range of motion.     Cervical back: Normal range of motion and neck supple.     Left lower leg: No edema.     Comments: All 4 extremities put through range of motion,  there is no obvious signs of deformity, compartments are soft and the joints are supple diffusely.  There is no tenderness over the cervical thoracic spines or the chest wall  Lymphadenopathy:     Cervical: No cervical adenopathy.  Skin:    General: Skin is warm and dry.     Findings: No erythema or rash.  Neurological:     Mental Status: He is alert.     Coordination: Coordination normal.     Comments: The patient is extremely hard of hearing at baseline but is able to answer my questions and follow commands without any difficulty whatsoever.  He has a normal level of alertness, normal speech and no facial droop or focal weakness  Psychiatric:        Behavior: Behavior normal.     (all labs ordered are listed, but only abnormal results are displayed) Labs Reviewed - No data to display  EKG: None  Radiology: CT Cervical Spine Wo Contrast Result Date: 01/29/2024 EXAM: CT CERVICAL SPINE WITHOUT CONTRAST 01/29/2024 10:53:46 PM TECHNIQUE: CT of the cervical spine was performed without the administration of intravenous contrast. Multiplanar reformatted images are provided for review. Automated exposure control, iterative reconstruction, and/or weight based adjustment of the mA/kV was utilized to reduce the radiation dose to as low as reasonably achievable. COMPARISON: None available. CLINICAL HISTORY: Polytrauma, blunt. FINDINGS: BONES AND ALIGNMENT: No acute fracture or traumatic malalignment. DEGENERATIVE CHANGES: Mild degenerative changes of the mid cervical spine. SOFT TISSUES: No prevertebral soft tissue swelling. IMPRESSION: 1. No acute findings. Electronically signed by: Pinkie Pebbles MD 01/29/2024 11:00 PM EST RP Workstation: [id number]   CT Head Wo Contrast Result Date: 01/29/2024 EXAM: CT HEAD WITHOUT 01/29/2024 10:53:46 PM TECHNIQUE: CT of the head was performed without the administration of intravenous contrast. Automated exposure control, iterative reconstruction, and/or weight  based adjustment of the mA/kV was utilized to reduce the radiation dose to as low as reasonably achievable. COMPARISON: None available. CLINICAL HISTORY: Facial trauma, blunt FINDINGS: BRAIN AND VENTRICLES: No acute intracranial hemorrhage. No mass effect or midline shift. No extra-axial fluid collection. No evidence of acute infarct. No hydrocephalus. Global cortical atrophy. Subcortical and periventricular small vessel ischemic changes. Intracranial atherosclerosis. ORBITS: No acute abnormality. SINUSES AND MASTOIDS: Mild mucosal thickening of the left frontal, bilateral ethmoid, and right maxillary sinuses. No acute abnormality of the mastoids. SOFT TISSUES AND SKULL: No acute skull fracture. No acute soft tissue abnormality. IMPRESSION: 1. No acute intracranial abnormality. Electronically signed by: Pinkie Pebbles MD 01/29/2024 10:59 PM EST RP Workstation: [id number]     Procedures   Medications  Ordered in the ED  bacitracin  ointment (has no administration in time range)  bacitracin  ointment (has no administration in time range)                                    Medical Decision Making Amount and/or Complexity of Data Reviewed Radiology: ordered.  Risk OTC drugs.    This patient presents to the ED for concern of fall with head injury differential diagnosis includes cranial hematoma, fracture, concussion, minor head injury    Additional history obtained   Additional history obtained from Electronic Medical Record External records from outside source obtained and reviewed including medical record, anticoagulation visits with his family doctor, paroxysmal atrial fibrillation history, prior fall seen in the outside emergency departments, no recent admissions to the hospital   Imaging Studies ordered:  I ordered imaging studies including CT scan of the brain and cervical spine I independently visualized and interpreted imaging which showed no acute intracranial findings or  acute fractures I agree with the radiologist interpretation   Medicines ordered and prescription drug management:  I ordered medication including topical antibiotics, bacitracin     I have reviewed the patients home medicines and have made adjustments as needed   Problem List / ED Course:  Wound care, bacitracin    Social Determinants of Health:   Elderly   I have discussed with the patient at the bedside the results, and the meaning of these results.  They have had opportunity to ask questions,  expressed their understanding to the need for follow-up with primary care physician  Stable for discharge, requesting to be discharged to go take care of his wife, I think this is reasonable       Final diagnoses:  Minor head injury, initial encounter    ED Discharge Orders     None          Cleotilde Rogue, MD 01/29/24 2307

## 2024-01-29 NOTE — Discharge Instructions (Addendum)
 CT shows no signs of bleeding on the brain, no fracture of the neck, Tylenol  or ibuprofen for pain, to keep a topical antibiotic on the open wound
